# Patient Record
Sex: Female | Born: 1935 | Race: White | Hispanic: No | Smoking: Never smoker
Health system: Southern US, Community
[De-identification: ages and names within clinical notes are randomized; demographics above are authoritative.]

## PROBLEM LIST (undated history)

## (undated) DIAGNOSIS — S42009A Fracture of unspecified part of unspecified clavicle, initial encounter for closed fracture: Secondary | ICD-10-CM

## (undated) DIAGNOSIS — C801 Malignant (primary) neoplasm, unspecified: Secondary | ICD-10-CM

## (undated) DIAGNOSIS — K219 Gastro-esophageal reflux disease without esophagitis: Secondary | ICD-10-CM

## (undated) DIAGNOSIS — F329 Major depressive disorder, single episode, unspecified: Secondary | ICD-10-CM

## (undated) DIAGNOSIS — F0280 Dementia in other diseases classified elsewhere without behavioral disturbance: Secondary | ICD-10-CM

## (undated) DIAGNOSIS — S329XXA Fracture of unspecified parts of lumbosacral spine and pelvis, initial encounter for closed fracture: Secondary | ICD-10-CM

## (undated) DIAGNOSIS — E785 Hyperlipidemia, unspecified: Secondary | ICD-10-CM

## (undated) DIAGNOSIS — F419 Anxiety disorder, unspecified: Secondary | ICD-10-CM

## (undated) DIAGNOSIS — G309 Alzheimer's disease, unspecified: Secondary | ICD-10-CM

## (undated) DIAGNOSIS — I10 Essential (primary) hypertension: Secondary | ICD-10-CM

## (undated) DIAGNOSIS — Z66 Do not resuscitate: Secondary | ICD-10-CM

## (undated) DIAGNOSIS — F32A Depression, unspecified: Secondary | ICD-10-CM

## (undated) HISTORY — PX: FRACTURE SURGERY: SHX138

---

## 1998-05-09 ENCOUNTER — Encounter: Payer: Self-pay | Admitting: Orthopedic Surgery

## 1998-05-09 ENCOUNTER — Ambulatory Visit (HOSPITAL_COMMUNITY): Admission: RE | Admit: 1998-05-09 | Discharge: 1998-05-09 | Payer: Self-pay | Admitting: Orthopedic Surgery

## 1998-06-12 ENCOUNTER — Encounter: Payer: Self-pay | Admitting: Orthopedic Surgery

## 1998-06-13 ENCOUNTER — Encounter: Payer: Self-pay | Admitting: Orthopedic Surgery

## 1998-06-13 ENCOUNTER — Inpatient Hospital Stay (HOSPITAL_COMMUNITY): Admission: RE | Admit: 1998-06-13 | Discharge: 1998-06-19 | Payer: Self-pay | Admitting: Orthopedic Surgery

## 1998-07-15 ENCOUNTER — Encounter: Admission: RE | Admit: 1998-07-15 | Discharge: 1998-10-13 | Payer: Self-pay | Admitting: Orthopedic Surgery

## 2000-12-23 ENCOUNTER — Encounter (INDEPENDENT_AMBULATORY_CARE_PROVIDER_SITE_OTHER): Payer: Self-pay | Admitting: *Deleted

## 2000-12-24 ENCOUNTER — Encounter (INDEPENDENT_AMBULATORY_CARE_PROVIDER_SITE_OTHER): Payer: Self-pay | Admitting: *Deleted

## 2000-12-24 ENCOUNTER — Other Ambulatory Visit: Admission: RE | Admit: 2000-12-24 | Discharge: 2000-12-24 | Payer: Self-pay | Admitting: Gastroenterology

## 2000-12-24 ENCOUNTER — Encounter (INDEPENDENT_AMBULATORY_CARE_PROVIDER_SITE_OTHER): Payer: Self-pay | Admitting: Specialist

## 2001-06-03 ENCOUNTER — Encounter: Payer: Self-pay | Admitting: Ophthalmology

## 2001-06-07 ENCOUNTER — Ambulatory Visit (HOSPITAL_COMMUNITY): Admission: RE | Admit: 2001-06-07 | Discharge: 2001-06-08 | Payer: Self-pay | Admitting: Ophthalmology

## 2002-10-30 ENCOUNTER — Ambulatory Visit (HOSPITAL_COMMUNITY): Admission: RE | Admit: 2002-10-30 | Discharge: 2002-10-30 | Payer: Self-pay | Admitting: Family Medicine

## 2002-10-30 ENCOUNTER — Encounter: Payer: Self-pay | Admitting: Family Medicine

## 2003-12-12 ENCOUNTER — Ambulatory Visit (HOSPITAL_COMMUNITY): Admission: RE | Admit: 2003-12-12 | Discharge: 2003-12-12 | Payer: Self-pay | Admitting: Family Medicine

## 2004-07-02 ENCOUNTER — Ambulatory Visit: Payer: Self-pay | Admitting: Gastroenterology

## 2004-07-11 ENCOUNTER — Ambulatory Visit: Payer: Self-pay | Admitting: Gastroenterology

## 2004-07-17 ENCOUNTER — Ambulatory Visit: Payer: Self-pay | Admitting: Gastroenterology

## 2004-07-17 ENCOUNTER — Encounter (INDEPENDENT_AMBULATORY_CARE_PROVIDER_SITE_OTHER): Payer: Self-pay | Admitting: *Deleted

## 2005-03-10 ENCOUNTER — Ambulatory Visit (HOSPITAL_COMMUNITY): Admission: RE | Admit: 2005-03-10 | Discharge: 2005-03-10 | Payer: Self-pay | Admitting: Family Medicine

## 2005-11-23 ENCOUNTER — Ambulatory Visit: Payer: Self-pay | Admitting: Gastroenterology

## 2006-02-16 ENCOUNTER — Encounter (HOSPITAL_COMMUNITY): Admission: RE | Admit: 2006-02-16 | Discharge: 2006-03-18 | Payer: Self-pay | Admitting: Orthopedic Surgery

## 2006-03-23 ENCOUNTER — Encounter (HOSPITAL_COMMUNITY): Admission: RE | Admit: 2006-03-23 | Discharge: 2006-04-22 | Payer: Self-pay | Admitting: Orthopedic Surgery

## 2007-06-07 ENCOUNTER — Encounter: Admission: RE | Admit: 2007-06-07 | Discharge: 2007-06-07 | Payer: Self-pay | Admitting: Orthopedic Surgery

## 2007-12-12 ENCOUNTER — Ambulatory Visit (HOSPITAL_COMMUNITY): Admission: RE | Admit: 2007-12-12 | Discharge: 2007-12-12 | Payer: Self-pay | Admitting: Orthopedic Surgery

## 2008-02-03 ENCOUNTER — Encounter (HOSPITAL_COMMUNITY): Admission: RE | Admit: 2008-02-03 | Discharge: 2008-02-13 | Payer: Self-pay | Admitting: Internal Medicine

## 2008-02-03 ENCOUNTER — Ambulatory Visit (HOSPITAL_COMMUNITY): Payer: Self-pay | Admitting: Internal Medicine

## 2009-02-26 ENCOUNTER — Encounter (HOSPITAL_COMMUNITY): Admission: RE | Admit: 2009-02-26 | Discharge: 2009-03-28 | Payer: Self-pay | Admitting: Internal Medicine

## 2009-02-26 ENCOUNTER — Ambulatory Visit (HOSPITAL_COMMUNITY): Payer: Self-pay | Admitting: Internal Medicine

## 2009-06-05 ENCOUNTER — Emergency Department (HOSPITAL_COMMUNITY)
Admission: EM | Admit: 2009-06-05 | Discharge: 2009-06-05 | Payer: Self-pay | Source: Home / Self Care | Admitting: Emergency Medicine

## 2009-06-19 ENCOUNTER — Emergency Department (HOSPITAL_COMMUNITY): Admission: EM | Admit: 2009-06-19 | Discharge: 2009-06-20 | Payer: Self-pay | Admitting: Emergency Medicine

## 2009-10-03 ENCOUNTER — Ambulatory Visit (HOSPITAL_COMMUNITY): Admission: RE | Admit: 2009-10-03 | Discharge: 2009-10-03 | Payer: Self-pay | Admitting: Ophthalmology

## 2009-10-12 ENCOUNTER — Emergency Department (HOSPITAL_COMMUNITY): Admission: EM | Admit: 2009-10-12 | Discharge: 2009-10-12 | Payer: Self-pay | Admitting: Emergency Medicine

## 2009-10-16 ENCOUNTER — Emergency Department (HOSPITAL_COMMUNITY): Admission: EM | Admit: 2009-10-16 | Discharge: 2009-10-16 | Payer: Self-pay | Admitting: Emergency Medicine

## 2009-10-18 ENCOUNTER — Encounter: Payer: Self-pay | Admitting: Nurse Practitioner

## 2009-10-22 ENCOUNTER — Telehealth (INDEPENDENT_AMBULATORY_CARE_PROVIDER_SITE_OTHER): Payer: Self-pay | Admitting: *Deleted

## 2009-10-25 ENCOUNTER — Ambulatory Visit: Payer: Self-pay | Admitting: Internal Medicine

## 2009-10-25 DIAGNOSIS — F068 Other specified mental disorders due to known physiological condition: Secondary | ICD-10-CM | POA: Insufficient documentation

## 2009-10-25 DIAGNOSIS — Z85038 Personal history of other malignant neoplasm of large intestine: Secondary | ICD-10-CM | POA: Insufficient documentation

## 2009-10-25 DIAGNOSIS — K219 Gastro-esophageal reflux disease without esophagitis: Secondary | ICD-10-CM | POA: Insufficient documentation

## 2009-10-29 ENCOUNTER — Telehealth: Payer: Self-pay | Admitting: Internal Medicine

## 2009-12-19 ENCOUNTER — Ambulatory Visit: Payer: Self-pay | Admitting: Internal Medicine

## 2010-01-16 ENCOUNTER — Ambulatory Visit (HOSPITAL_COMMUNITY): Payer: Self-pay | Admitting: Internal Medicine

## 2010-01-16 ENCOUNTER — Encounter (HOSPITAL_COMMUNITY)
Admission: RE | Admit: 2010-01-16 | Discharge: 2010-02-14 | Payer: Self-pay | Source: Home / Self Care | Admitting: Internal Medicine

## 2010-06-17 NOTE — Progress Notes (Signed)
Summary: Bright red Blood in her stools  Phone Note From Other Clinic   Caller: [facility] @ Dr Dwana Melena [phone] Reason for Call: Schedule Patient Appt Summary of Call: Former pt of Dr Corinda Gubler last seen 2007-Colon 07-2004. Having some bright red blood in her stools and tried to schedule her appt to be seen sooner rather than later. First available not until July. Believes patient just needs a Colonoscopy scheduled directly.  Initial call taken by: Leanor Kail [facility],  October 22, 2009 2:55 PM  Follow-up for Phone Call        Pt. will see Willette Cluster NP on 10-25-09 at 1:30pm. Okey Regal will advise pt. of appt/med.list/co-pay/cx.policy and she will fax records to [phone]. Follow-up by: Laureen Ochs LPN,  October 23, 91 3:10 PM

## 2010-06-17 NOTE — Procedures (Signed)
Summary: Colonoscopy: Hemorrhoids   Colonoscopy  Procedure date:  07/17/2004  Findings:      Location:  [facility].    Colonoscopy  Procedure date:  07/17/2004  Findings:      Location:  [facility].     Patient Name: Kathy, Avery MRN:  Procedure Procedures: Colonoscopy CPT: [phone].  Personnel: Endoscopist: Ulyess Mort, MD.  Exam Location: Exam performed in Outpatient Clinic. Outpatient  Patient Consent: Procedure, Alternatives, Risks and Benefits discussed, consent obtained, from patient. Consent was obtained by the RN.  Indications Symptoms: Hematochezia.  Surveillance of: Colorectal Cancer.  History  Current Medications: Patient is not currently taking Coumadin.  Pre-Exam Physical: Entire physical exam was normal.  Exam Exam: Extent of exam reached: Cecum, extent intended: Cecum.  The cecum was identified by appendiceal orifice and IC valve. Colon retroflexion performed. Images were not taken. ASA Classification: II. Tolerance: good.  Monitoring: Pulse and BP monitoring, Oximetry used. Supplemental O2 given.  Colon Prep Prep results: good.  Sedation Meds: Patient assessed and found to be appropriate for moderate (conscious) sedation. Fentanyl 50 mcg. given IV. Versed 5 mg. given IV.  Findings MUCOSAL ABNORMALITY: Ascending Colon. Comments: area of thickened mucosa  caterized with hot bx.  - NOT SEEN ON EXAM: Cecum to Rectum. Polyps, AVM's, Colitis, Tumors, Melanosis, Crohn's,  - PRIOR SURGERY: Sigmoid Colon. Segmental Colectomy.  - HEMORRHOIDS: External. Size: Grade II. ICD9: Hemorrhoids, External: 455.3.   Assessment Abnormal examination, see findings above.  Diagnoses: 455.3: Hemorrhoids, External.   Events  Unplanned Interventions: No intervention was required.  Unplanned Events: There were no complications. Plans Medication Plan: Continue current medications. Fiber supplements:  Hemorrhoidal  Medications:   Patient Education: Patient given standard instructions for: Hemorrhoids. Yearly hemoccult testing recommended. prn.  Disposition: After procedure patient sent to recovery. After recovery patient sent home.    This report was created from the original endoscopy report, which was reviewed and signed by the above listed endoscopist.  Appended Document: Colonoscopy: Hemorrhoids    Colonoscopy  Procedure date:  07/17/2004  Findings:      Results: Hemorrhoids. Location:  [facility].  Findings MUCOSAL ABNORMALITY: Ascending Colon. Comments: area of thickened mucosa  caterized with hot bx.  - PRIOR SURGERY: Sigmoid Colon. Segmental Colectomy.  - HEMORRHOIDS: External. Size: Grade II

## 2010-06-17 NOTE — Assessment & Plan Note (Signed)
Summary: RECTAL BLEEDING          (OLD DR.SAM PT.)      DEBORAH   History of Present Illness Visit Type: new patient  Primary GI MD: Stan Head MD [facility] Primary Provider: Dwana Melena, MD  Requesting Provider: na Chief Complaint: rectal bleeding, and fatigue      History of Present Illness:   Patient last seen by Dr. Corinda Gubler in 2006. She has a history of Colorectal cancer.  Patient lives at [facility], has history of dementia and is brought in today by her son. Recently went to ER after a fall. While in the ER a nurse noticed blood in patien't's stool. ER hemoglobin was okay at 12.0.  Then  a few days ago she went back to ER for a second fall,  hgb was still okay at 12.1. Son dioesn't know if  there has been any further rectal bleeding. Patient denies constipation. She  sometimes has an "upset stomach"  for which son gives her Emetrol. No weight loss, in fact has gained weight.    GI Review of Systems    Reports weight gain.      Denies abdominal pain, acid reflux, belching, bloating, chest pain, dysphagia with liquids, dysphagia with solids, heartburn, loss of appetite, nausea, vomiting, vomiting blood, and  weight loss.      Reports rectal bleeding.     Denies anal fissure, black tarry stools, change in bowel habit, constipation, diarrhea, diverticulosis, fecal incontinence, heme positive stool, hemorrhoids, irritable bowel syndrome, jaundice, light color stool, liver problems, and  rectal pain.    Current Medications (verified): 1)  Crestor 10 Mg Tabs (Rosuvastatin Calcium) .... Take 1 Tab By Mouth Daily 2)  D 1000 Plus  Tabs (Fa-Cyanocobalamin-B6-D-Ca) .... Take 1 Cap By Mouth Daily 3)  Citalopram Hydrobromide 40 Mg Tabs (Citalopram Hydrobromide) .... Take 1 Tab By Mouth Daily 4)  Zantac 150 Mg Tabs (Ranitidine Hcl) .... Take 1 Tab Daily By Mouth Every Evening 5)  Prilosec Otc 20 Mg Tbec (Omeprazole Magnesium) .... Take 1 Cap By Mouth Every Morning 6)  Exelon 9.5 Mg/24hr Pt24  (Rivastigmine) .... Apply 1 Patch To Skin Once Daily. Remove Used Patch Before Applying New Patch. 7)  Vitamin C 500 Mg Tabs (Ascorbic Acid) .... Take 2 Tab By Mouth By Mouth Once 8)  Calcium 600 Mg Tabs (Calcium) .... One Tablet By Mouth Once Daily 9)  Tekturna 300 Mg Tabs (Aliskiren Fumarate) .... One Tablet By Mouth Once Daily 10)  Ketorolac Tromethamine 0.4 % Soln (Ketorolac Tromethamine) .... As Directed 11)  Namenda 10 Mg Tabs (Memantine Hcl) .... One Tablet By Mouth Once Daily 12)  Clonazepam 0.5 Mg Tabs (Clonazepam) .... One Tablet By Mouth Once Daily 13)  Pred Forte 1 % Susp (Prednisolone Acetate) .... As  Directed 14)  Ocuflox 0.3 % Soln (Ofloxacin) .... As Directed 15)  Acetaminophen 500 Mg Tabs (Acetaminophen) .... One Tablet By Mouth Every 8-Hours As Needed For Pain 16)  Promethazine Hcl 12.5 Mg Tabs (Promethazine Hcl) .... As Needed For Nausea 17)  Lasix 20 Mg Tabs (Furosemide) .... Once Daily By Mouth  Allergies (verified): No Known Drug Allergies  Past History:  Past Medical History: Anxiety Disorder Arthritis Colorectal Cancer Hyperlipidemia Hypertension Dementia GERD  Past Surgical History: Cholecystectomy Colon Resection  Family History: No FH of Colon Cancer:  Social History: Retired Widowed Childern  Patient is a former smoker.  Alcohol Use - no Illicit Drug Use - no Smoking Status:  quit Drug  Use:  no  Review of Systems       The patient complains of anxiety-new, arthritis/joint pain, confusion, depression-new, fatigue, and swelling of feet/legs.  The patient denies allergy/sinus, anemia, back pain, blood in urine, breast changes/lumps, change in vision, cough, coughing up blood, fainting, fever, headaches-new, hearing problems, heart murmur, heart rhythm changes, itching, menstrual pain, muscle pains/cramps, night sweats, nosebleeds, pregnancy symptoms, shortness of breath, skin rash, sleeping problems, sore throat, swollen lymph glands, thirst -  excessive, urination - excessive, urination changes/pain, urine leakage, vision changes, and voice change.    Vital Signs:  Patient profile:   77 year old female Height:      67 inches Weight:      244 pounds BMI:     38.35 BSA:     2.20 Pulse rate:   76 / minute Pulse rhythm:   regular BP sitting:   110 / 60  (left arm) Cuff size:   regular  Vitals Entered By: Ok Anis CMA (October 25, 2009 1:41 PM)  Physical Exam  General:  Well developed, well nourished, no acute distress. Head:  Normocephalic and atraumatic. Eyes:  Conjunctiva pink, no icterus.  Mouth:  No oral lesions. Tongue moist.  Neck:  no obvious masses  Lungs:  Clear throughout to auscultation. Heart:  Regular rate and rhythm; no murmurs, rubs,  or bruits. Abdomen:  Abdomen soft, nontender, nondistended. No obvious masses or hepatomegaly.Normal bowel sounds.  Rectal:  No pain on digital exam. No external hemorrhoids. No fissure. No internal masses felt. Small amount brown, heme negative brown stool. On anoscopic view, internal hemorrhoids were seen.  Msk:  Symmetrical with no gross deformities. Normal posture. Extremities:  No clubbing, cyanosis, edema or deformities noted. Neurologic:  Alert, poor memory. Skin:  Intact without significant lesions or rashes. Cervical Nodes:  No significant cervical adenopathy. Psych:  Alert and cooperative. Normal mood and affect.   Impression & Recommendations:  Problem # 1:  RECTAL BLEEDING (ICD-569.3) Son provides history. Blood in stool noticed by a nurse on May      Hemoglobin has been stable around 12.0 since then. Internal hemorrhoids on exam. Will treat with Aunusol HC suppositories. Do not know if she has problems with constipation but will ask [facility] to give daily Miralax if needed.   Problem # 2:  PERSONAL HX COLON CANCER (ICD-V10.05) Assessment: Comment Only S/P resection 1995. Her last surveillance colonoscopy was March 2006. Findings included area of  thickened mucosa in ascending colon. Area cauterized with hot biopsy but unable to locate any pathology report. Dr. Leone Payor will resume patient's care since Dr. Corinda Gubler has retired. Will defer to him as to when patient's next surveillance colonoscopy should be.   ATTENDING: I will review all pertinent records, but think we should discuss either by phone or with officevisit prior to proceeding. Iva Boop MD, [facility]  October 28, 2009 10:43 AM  Problem # 3:  GERD (ICD-530.81) According to son, patient used to have heartburn problems. She is on PPI and H2 blocker.   Problem # 4:  DEMENTIA (ICD-294.8)  Patient Instructions: 1)  Attn: Clare Gandy, RN: 2)  Please call our office for any further rectal bleedingl  3)  Please make sure patient doesn not get constipated. She should take a capful of Miralax daily for hard stools and / or straining.  4)  Anusol HC suppositories prescription given. 5)  Will call Unice Cobble, patient's son about when next colonoscopy for history of colon cancer will  need to be done.Marland Kitchen His number is 6)  [phone]. 7)  The medication list was reviewed and reconciled.  All changed / newly prescribed medications were explained.  A complete medication list was provided to the patient / caregiver. Prescriptions: ANUSOL-HC 25 MG SUPP (HYDROCORTISONE ACETATE) use 1 suppository at bedtime  #10 x 0   Entered by:   Lowry Ram NCMA   Authorized by:   Willette Cluster NP   Signed by:   Lowry Ram NCMA on 10/25/2009   Method used:   Print then Give to Patient   RxID:   [id number]   Appended Document: RECTAL BLEEDING          (OLD DR.SAM PT.)      [facility]   Colonoscopy  Procedure date:  07/17/2004  Findings:      Results: Hemorrhoids.     Dr. Blossom Hoops    Past History:  Past Medical History:    Anxiety Disorder    Arthritis    Colorectal Cancer 1981    Hyperlipidemia    Hypertension    Dementia    GERD  Past Surgical History:    Cholecystectomy     Colon Resection 1981

## 2010-06-17 NOTE — Progress Notes (Signed)
Summary: call son re: f/u  Phone Note Outgoing Call Call back at [facility]   Summary of Call: please contact son re: need to arrange follow-up visit with him and mom to discuss if further evaluation of rectal bleeding needed as long as improving with hemorrhoid Tx I can see them in July Iva Boop MD, [facility]  October 29, 2009 7:28 AM   Follow-up for Phone Call        no answer and no machine at the home phone, I will continue to try and reach the patient. Darcey Nora RN, CGRN  October 29, 2009 9:02 AM  no answer, no machine.  We will continue to try and reach the patient and her son Follow-up by: Darcey Nora RN, CGRN,  October 29, 2009 2:34 PM  Additional Follow-up for Phone Call Additional follow up Details #1::        Message left on son's (John) cell phone voicemail to return call. Francee Piccolo CMA Duncan Dull)  October 30, 2009 9:20 AM  Son, [place], returned call.  He states his mother lives at an assisted living facility and he is unsure how she is doing.  He will call assisted living tomorrow (he is traveling today) to see if hemorrhoid treatment is working.  He has to coordinate with his brother, Jonny Ruiz to schedule an appt. as her POA will have to be with her at any appt.  Orvilla Fus will return my call on Friday. Additional Follow-up by: Francee Piccolo CMA Duncan Dull),  October 30, 2009 4:02 PM    Additional Follow-up for Phone Call Additional follow up Details #2::    Call to John on cell, appt scheduled for 11/26/09.  As far as he knows bleeding is better and she is doing well. Follow-up by: Francee Piccolo CMA Duncan Dull),  November 14, 2009 1:29 PM

## 2010-06-17 NOTE — Procedures (Signed)
Summary: COLONOSCOPY   Colonoscopy  Procedure date:  12/23/2000  Findings:      Location:  [facility].     Patient Name: Kathy Avery, Kathy Avery MRN:  Procedure Procedures: Colonoscopy CPT: [phone].  Personnel: Endoscopist: Ulyess Mort, MD.  Exam Location: Exam performed in Outpatient Clinic. Outpatient  Patient Consent: Procedure, Alternatives, Risks and Benefits discussed, consent obtained, from patient.  Indications  Surveillance of: Colorectal Cancer. Last exam: Jun,  History  Pre-Exam Physical: Performed Dec 23, 2000. Cardio-pulmonary exam, HEENT exam , Abdominal exam, Extremity exam, Mental status exam WNL.  Exam Exam: Extent of exam reached: Cecum, extent intended: Cecum.  The cecum was identified by IC valve. Patient position: on left side. Colon retroflexion performed. Images taken. ASA Classification: II. Tolerance: good.  Monitoring: Pulse and BP monitoring, Oximetry used. Supplemental O2 given.  Colon Prep Used Golytely for colon prep. Prep results: fair.  Sedation Meds: Patient assessed and found to be appropriate for moderate (conscious) sedation. Residual sedation present from prior procedure today.  Fentanyl 100 mcg. Versed 10 mg.  Findings , IMAGE TAKEN - ANATOMICAL DEFORMITY: Sigmoid Colon. anastamosis.  POLYP: Sigmoid Colon, Maximum size: 4 mm. sessile polyp. Procedure:  hot biopsy, removed, retrieved, Polyp sent to pathology. ICD9: Colon Polyps: 211.3.   Assessment Abnormal examination, see findings above.  Diagnoses: 211.3: Colon Polyps.   Events  Unplanned Interventions: No intervention was required.  Unplanned Events: There were no complications. Plans Medication Plan: Continue current medications.  Patient Education: Patient given standard instructions for: Polyps. Yearly hemoccult testing recommended. Patient instructed to get routine colonoscopy every 5 years.  Disposition: After procedure patient sent to  recovery. After recovery patient sent home.    This report was created from the original endoscopy report, which was reviewed and signed by the above listed endoscopist.

## 2010-06-17 NOTE — Letter (Signed)
Summary: Dwana Melena MD  Dwana Melena MD   Imported By: Lester [place] 11/13/2009 11:09:27  _____________________________________________________________________  External Attachment:    Type:   Image     Comment:   External Document

## 2010-06-17 NOTE — Procedures (Signed)
Summary: ENDOSCOPY   EGD  Procedure date:  12/23/2000  Findings:      Location: [facility]     Patient Name: Kathy Avery, Kathy Avery MRN:  Procedure Procedures: Panendoscopy (EGD) CPT: 43235.  Personnel: Endoscopist: Ulyess Mort, MD.  Exam Location: Exam performed in Outpatient Clinic. Outpatient  Patient Consent: Procedure, Alternatives, Risks and Benefits discussed, consent obtained, from patient.  Indications Symptoms: Reflux symptoms  Surveillance of: H. pylori.  History  Pre-Exam Physical: Performed Dec 23, 2000  Cardio-pulmonary exam, HEENT exam, Abdominal exam, Extremity exam, Mental status exam WNL.  Exam Exam Info: Maximum depth of insertion Duodenum, intended Duodenum. Patient position: on left side. Vocal cords visualized. Gastric retroflexion performed. Images were not taken. ASA Classification: II. Tolerance: good.  Sedation Meds: Patient assessed and found to be appropriate for moderate (conscious) sedation. Fentanyl 50 mcg. Versed 7 mg. Cetacaine Spray 1 sprays  Monitoring: BP and pulse monitoring done. Oximetry used. Supplemental O2 given  Findings - HIATAL HERNIA: 2 cms. in length. Comments: mild esophagitis.  - MUCOSAL ABNORMALITY: Body to Antrum. Hemorrhage (oozing). Erythematous mucosa. Granular mucosa. RUT done, results pending. Comment: mild hemmorhagic antritis.  - MUCOSAL ABNORMALITY: Duodenal Bulb to Duodenal 2nd Portion. Granular mucosa. Comment: min duodenitis.   Assessment Abnormal examination, see findings above.  Events  Unplanned Intervention: No unplanned interventions were required.  Unplanned Events: There were no complications. Plans Medication(s): Continue current medications.  Disposition: After procedure patient sent to recovery. After recovery patient sent home.    This report was created from the original endoscopy report, which was reviewed and signed by the above listed endoscopist.   Appended  Document: EGD   EGD  Procedure date:  12/23/2000  Findings:      Findings - HIATAL HERNIA: 2 cms. in length. Comments: mild esophagitis.  - MUCOSAL ABNORMALITY: Body to Antrum. Hemorrhage (oozing). Erythematous mucosa. Granular mucosa. RUT done, results pending. Comment: mild hemmorhagic antritis.  - MUCOSAL ABNORMALITY: Duodenal Bulb to Duodenal 2nd Portion. Granular mucosa. Comment: min duodenitis.

## 2010-06-17 NOTE — Assessment & Plan Note (Signed)
Summary: FOLLOW UP RECTAL BLEEDING/SP   History of Present Illness Visit Type: Follow-up Visit Primary GI MD: Stan Head MD [facility] Primary Prentice Sackrider: Dwana Melena, MD  Requesting Jayelle Page: na Chief Complaint: follow-up rectal bleeding History of Present Illness:   77 yo ww seen within past few weeks due to rectal bleeding. She had a hx of hemorrhoids and these were seen again at anoscopy. Hydrocortisone suppositories were prescribed and she has not had further bleeding. She is demented and history largely comes from son.   GI Review of Systems      Denies abdominal pain, acid reflux, belching, bloating, chest pain, dysphagia with liquids, dysphagia with solids, heartburn, loss of appetite, nausea, vomiting, vomiting blood, weight loss, and  weight gain.        Denies anal fissure, black tarry stools, change in bowel habit, constipation, diarrhea, diverticulosis, fecal incontinence, heme positive stool, hemorrhoids, irritable bowel syndrome, jaundice, light color stool, liver problems, rectal bleeding, and  rectal pain.    Current Medications (verified): 1)  Crestor 10 Mg Tabs (Rosuvastatin Calcium) .... Take 1 Tab By Mouth Daily 2)  D 1000 Plus  Tabs (Fa-Cyanocobalamin-B6-D-Ca) .... Take 1 Cap By Mouth Daily 3)  Citalopram Hydrobromide 40 Mg Tabs (Citalopram Hydrobromide) .... Take 1 Tab By Mouth Daily 4)  Zantac 150 Mg Tabs (Ranitidine Hcl) .... Take 1 Tab Daily By Mouth Every Evening 5)  Prilosec Otc 20 Mg Tbec (Omeprazole Magnesium) .... Take 1 Cap By Mouth Every Morning 6)  Exelon 9.5 Mg/24hr Pt24 (Rivastigmine) .... Apply 1 Patch To Skin Once Daily. Remove Used Patch Before Applying New Patch. 7)  Vitamin C 500 Mg Tabs (Ascorbic Acid) .... Take 2 Tab By Mouth By Mouth Once 8)  Calcium 600 Mg Tabs (Calcium) .... One Tablet By Mouth Once Daily 9)  Tekturna 300 Mg Tabs (Aliskiren Fumarate) .... One Tablet By Mouth Once Daily 10)  Ketorolac Tromethamine 0.4 % Soln (Ketorolac Tromethamine)  .... As Directed 11)  Namenda 10 Mg Tabs (Memantine Hcl) .... One Tablet By Mouth Once Daily 12)  Clonazepam 0.5 Mg Tabs (Clonazepam) .... One Tablet By Mouth Once Daily 13)  Acetaminophen 500 Mg Tabs (Acetaminophen) .... One Tablet By Mouth Every 8-Hours As Needed For Pain 14)  Promethazine Hcl 12.5 Mg Tabs (Promethazine Hcl) .... As Needed For Nausea 15)  Lasix 20 Mg Tabs (Furosemide) .... Once Daily By Mouth 16)  Anusol-Hc 25 Mg Supp (Hydrocortisone Acetate) .... Use 1 Suppository At Bedtime  Allergies (verified): No Known Drug Allergies  Past History:  Past Medical History: Reviewed history from 10/29/2009 and no changes required. Anxiety Disorder Arthritis Colorectal Cancer 1981 Hyperlipidemia Hypertension Dementia GERD  Past Surgical History: Reviewed history from 10/29/2009 and no changes required. Cholecystectomy Colon Resection 1981  Family History: Reviewed history from 10/25/2009 and no changes required. No FH of Colon Cancer:  Social History: Reviewed history from 10/25/2009 and no changes required. Retired Industrial/product designer  Patient is a former smoker.  Alcohol Use - no Illicit Drug Use - no  Vital Signs:  Patient profile:   77 year old female Height:      67 inches Weight:      161 pounds BMI:     25.31 Pulse rate:   80 / minute Pulse rhythm:   regular BP sitting:   108 / 52  (left arm)  Vitals Entered By: Milford Cage NCMA (December 19, 2009 2:48 PM)  Physical Exam  General:  Well developed, well nourished, no acute distress. elderly  and deented.   Impression & Recommendations:  Problem # 1:  HEMORRHOIDS, WITH BLEEDING (ICD-455.8) Assessment Improved History and anoscopic exam consistent with this.  Problem # 2:  PERSONAL HX COLON CANCER (ICD-V10.05) Assessment: Comment Only Remote resection 1981 with last two colonoscopies at least without adenomas. I do not recommend continuing screening exams due to age and comorbidities. Son is  aware and will discuss with his brother. The patient is unable to decide this given her dementia.  Patient Instructions: 1)  The rectal bleeding was from hemorroids and appears to be resolved. 2)  Dr. Leone Payor does not recommend continued routine colonoscopy at this time due to age and comorbidities. the risks of the procedure do not seem to outweigh the possible benefits. He understands that your sons will discuss this recommendation and call back with questions or requests. 3)  Copy sent to : Dwana Melena, MD 4)  The medication list was reviewed and reconciled.  All changed / newly prescribed medications were explained.  A complete medication list was provided to the patient / caregiver.

## 2010-08-03 LAB — URINE CULTURE: Colony Count: >=100000

## 2010-08-03 LAB — URINALYSIS, ROUTINE W REFLEX MICROSCOPIC
Bilirubin Urine: NEGATIVE
Glucose, UA: NEGATIVE mg/dL
Hgb urine dipstick: NEGATIVE
Nitrite: POSITIVE — AB
Protein, ur: NEGATIVE mg/dL
Specific Gravity, Urine: 1.025 (ref 1.005–1.030)

## 2010-08-03 LAB — CBC
HCT: 38.7 % (ref 36.0–46.0)
Hemoglobin: 12.9 g/dL (ref 12.0–15.0)
MCHC: 33.5 g/dL (ref 30.0–36.0)
MCV: 93.5 fL (ref 78.0–100.0)
Platelets: 166 10*3/uL (ref 150–400)
WBC: 5.4 10*3/uL (ref 4.0–10.5)

## 2010-08-03 LAB — URINE MICROSCOPIC-ADD ON

## 2010-08-03 LAB — DIFFERENTIAL
Basophils Relative: 1 % (ref 0–1)
Eosinophils Absolute: 0 10*3/uL (ref 0.0–0.7)
Eosinophils Relative: 1 % (ref 0–5)
Lymphs Abs: 1 10*3/uL (ref 0.7–4.0)
Monocytes Absolute: 0.6 10*3/uL (ref 0.1–1.0)
Monocytes Relative: 12 % (ref 3–12)
Neutro Abs: 3.7 10*3/uL (ref 1.7–7.7)

## 2010-08-03 LAB — COMPREHENSIVE METABOLIC PANEL
ALT: 22 U/L (ref 0–35)
AST: 21 U/L (ref 0–37)
Albumin: 3.7 g/dL (ref 3.5–5.2)
CO2: 32 mEq/L (ref 19–32)
Calcium: 9.2 mg/dL (ref 8.4–10.5)
Creatinine, Ser: 1.09 mg/dL (ref 0.4–1.2)
Total Protein: 5.9 g/dL — ABNORMAL LOW (ref 6.0–8.3)

## 2010-08-04 LAB — DIFFERENTIAL
Basophils Absolute: 0 10*3/uL (ref 0.0–0.1)
Basophils Relative: 1 % (ref 0–1)
Basophils Relative: 1 % (ref 0–1)
Eosinophils Absolute: 0 10*3/uL (ref 0.0–0.7)
Eosinophils Relative: 1 % (ref 0–5)
Lymphocytes Relative: 10 % — ABNORMAL LOW (ref 12–46)
Lymphs Abs: 1 10*3/uL (ref 0.7–4.0)
Monocytes Absolute: 0.3 10*3/uL (ref 0.1–1.0)
Monocytes Relative: 12 % (ref 3–12)
Neutro Abs: 4.6 10*3/uL (ref 1.7–7.7)
Neutrophils Relative %: 68 % (ref 43–77)
Neutrophils Relative %: 84 % — ABNORMAL HIGH (ref 43–77)

## 2010-08-04 LAB — CBC
HCT: 36.6 % (ref 36.0–46.0)
Hemoglobin: 12.1 g/dL (ref 12.0–15.0)
MCHC: 33.1 g/dL (ref 30.0–36.0)
MCHC: 33.2 g/dL (ref 30.0–36.0)
MCV: 92.6 fL (ref 78.0–100.0)
Platelets: 139 10*3/uL — ABNORMAL LOW (ref 150–400)
Platelets: 119 10*3/uL — ABNORMAL LOW (ref 150–400)
RDW: 13.4 % (ref 11.5–15.5)

## 2010-08-04 LAB — BASIC METABOLIC PANEL
CO2: 29 mEq/L (ref 19–32)
Calcium: 9.1 mg/dL (ref 8.4–10.5)
Calcium: 8.8 mg/dL (ref 8.4–10.5)
Chloride: 105 mEq/L (ref 96–112)
Creatinine, Ser: 1.1 mg/dL (ref 0.4–1.2)
GFR calc Af Amer: 55 mL/min — ABNORMAL LOW (ref >60)
GFR calc non Af Amer: 45 mL/min — ABNORMAL LOW (ref >60)
GFR calc non Af Amer: 47 mL/min — ABNORMAL LOW (ref >60)
Potassium: 4 mEq/L (ref 3.5–5.1)
Sodium: 140 mEq/L (ref 135–145)
Sodium: 139 mEq/L (ref 135–145)

## 2010-08-04 LAB — URINALYSIS, ROUTINE W REFLEX MICROSCOPIC
Glucose, UA: NEGATIVE mg/dL
Nitrite: NEGATIVE
Protein, ur: NEGATIVE mg/dL
Urobilinogen, UA: 0.2 mg/dL (ref 0.0–1.0)

## 2010-08-04 LAB — POCT CARDIAC MARKERS
CKMB, poc: <1 ng/mL — ABNORMAL LOW (ref 1.0–8.0)
Troponin i, poc: <0.05 ng/mL (ref 0.00–0.09)

## 2010-08-05 LAB — BASIC METABOLIC PANEL
BUN: 30 mg/dL — ABNORMAL HIGH (ref 6–23)
Creatinine, Ser: 1.22 mg/dL — ABNORMAL HIGH (ref 0.4–1.2)
GFR calc non Af Amer: 42 mL/min — ABNORMAL LOW (ref >60)
Glucose, Bld: 102 mg/dL — ABNORMAL HIGH (ref 70–99)
Potassium: 4.6 mEq/L (ref 3.5–5.1)

## 2010-08-05 LAB — HEMOGLOBIN AND HEMATOCRIT, BLOOD: Hemoglobin: 12.7 g/dL (ref 12.0–15.0)

## 2010-08-06 LAB — URINALYSIS, ROUTINE W REFLEX MICROSCOPIC
Bilirubin Urine: NEGATIVE
Glucose, UA: NEGATIVE mg/dL
Hgb urine dipstick: NEGATIVE
Specific Gravity, Urine: <1.005 — ABNORMAL LOW (ref 1.005–1.030)

## 2010-08-06 LAB — BASIC METABOLIC PANEL
CO2: 28 mEq/L (ref 19–32)
Chloride: 105 mEq/L (ref 96–112)
GFR calc Af Amer: >60 mL/min (ref >60)
Glucose, Bld: 94 mg/dL (ref 70–99)
Sodium: 139 mEq/L (ref 135–145)

## 2010-08-06 LAB — DIFFERENTIAL
Basophils Absolute: 0 10*3/uL (ref 0.0–0.1)
Basophils Relative: 1 % (ref 0–1)
Eosinophils Absolute: 0 10*3/uL (ref 0.0–0.7)
Eosinophils Relative: 1 % (ref 0–5)
Monocytes Absolute: 0.6 10*3/uL (ref 0.1–1.0)
Monocytes Relative: 11 % (ref 3–12)

## 2010-08-06 LAB — POCT CARDIAC MARKERS
CKMB, poc: <1 ng/mL — ABNORMAL LOW (ref 1.0–8.0)
Myoglobin, poc: 62.5 ng/mL (ref 12–200)
Troponin i, poc: <0.05 ng/mL (ref 0.00–0.09)
Troponin i, poc: <0.05 ng/mL (ref 0.00–0.09)

## 2010-08-06 LAB — CBC
HCT: 35.2 % — ABNORMAL LOW (ref 36.0–46.0)
Hemoglobin: 12.1 g/dL (ref 12.0–15.0)
MCHC: 34.3 g/dL (ref 30.0–36.0)
MCV: 92.4 fL (ref 78.0–100.0)
RBC: 3.8 MIL/uL — ABNORMAL LOW (ref 3.87–5.11)
RDW: 13 % (ref 11.5–15.5)

## 2010-09-12 ENCOUNTER — Emergency Department (HOSPITAL_COMMUNITY): Payer: Medicare Other

## 2010-09-12 ENCOUNTER — Emergency Department (HOSPITAL_COMMUNITY)
Admission: EM | Admit: 2010-09-12 | Discharge: 2010-09-12 | Disposition: A | Discharge status: Home / Self Care | Payer: Medicare Other | Attending: Emergency Medicine | Admitting: Emergency Medicine

## 2010-09-12 DIAGNOSIS — E785 Hyperlipidemia, unspecified: Secondary | ICD-10-CM | POA: Insufficient documentation

## 2010-09-12 DIAGNOSIS — Z79899 Other long term (current) drug therapy: Secondary | ICD-10-CM | POA: Insufficient documentation

## 2010-09-12 DIAGNOSIS — T07XXXA Unspecified multiple injuries, initial encounter: Secondary | ICD-10-CM | POA: Insufficient documentation

## 2010-09-12 DIAGNOSIS — N390 Urinary tract infection, site not specified: Secondary | ICD-10-CM | POA: Insufficient documentation

## 2010-09-12 DIAGNOSIS — G309 Alzheimer's disease, unspecified: Secondary | ICD-10-CM | POA: Insufficient documentation

## 2010-09-12 DIAGNOSIS — I10 Essential (primary) hypertension: Secondary | ICD-10-CM | POA: Insufficient documentation

## 2010-09-12 DIAGNOSIS — Y9210 Unspecified residential institution as the place of occurrence of the external cause: Secondary | ICD-10-CM | POA: Insufficient documentation

## 2010-09-12 DIAGNOSIS — W010XXA Fall on same level from slipping, tripping and stumbling without subsequent striking against object, initial encounter: Secondary | ICD-10-CM | POA: Insufficient documentation

## 2010-09-12 DIAGNOSIS — F0280 Dementia in other diseases classified elsewhere without behavioral disturbance: Secondary | ICD-10-CM | POA: Insufficient documentation

## 2010-09-12 LAB — CBC
MCH: 30 pg (ref 26.0–34.0)
MCHC: 32 g/dL (ref 30.0–36.0)
Platelets: 152 10*3/uL (ref 150–400)
RBC: 4.13 MIL/uL (ref 3.87–5.11)

## 2010-09-12 LAB — BASIC METABOLIC PANEL
BUN: 25 mg/dL — ABNORMAL HIGH (ref 6–23)
Calcium: 9.1 mg/dL (ref 8.4–10.5)
Chloride: 101 mEq/L (ref 96–112)
Creatinine, Ser: 1.13 mg/dL (ref 0.4–1.2)

## 2010-09-12 LAB — URINALYSIS, ROUTINE W REFLEX MICROSCOPIC
Ketones, ur: NEGATIVE mg/dL
Nitrite: NEGATIVE
Protein, ur: NEGATIVE mg/dL
pH: 6 (ref 5.0–8.0)

## 2010-09-12 LAB — DIFFERENTIAL
Basophils Absolute: 0 10*3/uL (ref 0.0–0.1)
Basophils Relative: 1 % (ref 0–1)
Eosinophils Absolute: 0.1 10*3/uL (ref 0.0–0.7)
Monocytes Relative: 12 % (ref 3–12)
Neutrophils Relative %: 62 % (ref 43–77)

## 2010-09-14 LAB — URINE CULTURE
Colony Count: 50000
Culture  Setup Time: 201204282045

## 2010-10-03 NOTE — H&P (Signed)
[place]. [facility]  Patient:    Kathy Avery, Kathy Avery Visit Number: [id number] MRN: [id number]          Service Type: DSU Location: 5700 5713 01 Attending Physician:  Ivor Messier Dictated by:   Guadelupe Sabin, M.D. Admit Date:  06/07/2001                           History and Physical  REASON FOR ADMISSION:  This was a planned outpatient surgical admission of this 77 year old white female admitted for cataract implant surgery of the right eye.  PRESENT ILLNESS:  This patient has been followed in my office since May 20, 1992.  Initial examination at that time revealed a corrected acuity of 20/30 right eye, 20/25 left eye.  Acute posterior vitreous detachment was noted in the right eye but no retinal tear seen.  The patient returned to the office in 2002 stating that she was having trouble reading fine print and that blurred vision both at near and distance troubled her.  Cataract had been noted by Dr. Katrinka Blazing, her regular physician.  Examination revealed an acuity of 20/60 right eye, 20/40 left eye.  Nuclear cataract formation was confirmed.  Due to the patients problems in her daily living it was elected to proceed with cataract implant surgery.  She signed an informed consent and arrangements were made for her outpatient admission.  PAST MEDICAL HISTORY:  Patient continues under the care of her regular physician in [place], Dr. Elesa Hacker.  She is felt to be in satisfactory condition for the proposed surgery.  Dr. Elesa Hacker notes the patients good general health, slight anxiety, stable general cardiorespiratory system.  CURRENT MEDICATIONS:  Norvasc, Prevacid, Actonel, Maxzide, Klonopin, one 81 mg aspirin, and Celexa.  ALLERGIES:  She is said to be allergic to MILK and MSO4 drugs.  She is said to be a low surgical risk.  REVIEW OF SYSTEMS:  No cardiorespiratory complaints.  PHYSICAL EXAMINATION:  VITAL SIGNS:  Heart rate 85, respirations  18, temperature 97.5, blood pressure 148/62.  GENERAL APPEARANCE:  Patient is a pleasant, well-nourished, well-developed 77 year old white female in no acute distress.  HEENT:  Eyes:  Visual acuity is noted above.  Applanation tonometry:  Normal. Slight lamp examination:  Nuclear cataract formation both eyes, right greater than left.  Fundus examination:  Clear vitreous, attached retina, normal optic nerve, blood vessels, and macula.  CHEST:  Lungs clear to percussion and auscultation.  HEART:  Normal sinus rhythm, no cardiomegaly, no murmurs.  ABDOMEN:  Negative.  EXTREMITIES:  Negative.  ADMISSION DIAGNOSIS:  Senile nuclear cataract, both eyes.  SURGICAL PLAN:  Cataract implant surgery - right eye now, left eye later. Patient is to stay in 23-hour observation unit as she lives alone and has no one to help her with her medications. Dictated by:   Guadelupe Sabin, M.D. Attending Physician:  Ivor Messier DD:  06/07/01 TD:  06/07/01 Job: [id number]

## 2010-10-03 NOTE — Discharge Summary (Signed)
[place]. [facility]  Patient:    CHAYLEE, EHRSAM Visit Number: [id number] MRN: [id number]          Service Type: DSU Location: 5700 5713 01 Attending Physician:  Ivor Messier Dictated by:   Guadelupe Sabin, M.D. Admit Date:  06/07/2001 Discharge Date: 06/08/2001                             Discharge Summary  This was a planned outpatient surgical admission of a 77 year old white female admitted for cataract implant of the right eye.  HOSPITAL COURSE:  The patient was felt to be in satisfactory condition for the proposed surgery.  She therefore was taken into the operating room where a planned extracapsular cataract extraction with primary insertion of a posterior chamber intraocular lens implant of the right eye was inserted under local anesthesia.  The patient tolerated the 45 minute period of surgery well and was then taken to the recovery room and subsequently to the 23 hour observation unit.  The patient because she lived alone and was unable to use the eyedrops safely home on the first day of surgery.  The patient was begun on topical Tobradex and Trusopt ophthalmic solution. The patient was seen on the evening of surgery and the following morning.  The following morning, slit lamp examination revealed a clear cornea, deep and clear anterior chamber.  The pupil was round and reactive and applanation tonometry 16 mm.  The patient was given a printed list of discharge instructions on the care and use of the operated eye at home.  DISCHARGE OCULAR MEDICATIONS:  Tobradex ophthalmic solution 1 drop every two hours for seven more days.  FOLLOW-UP:  Appointment my office three to five days.  CONDITION AT DISCHARGE:  Improved.  DISCHARGE DIAGNOSES:  Senile cataract, right eye. Dictated by:   Guadelupe Sabin, M.D. Attending Physician:  Ivor Messier DD:  06/26/01 TD:  06/27/01 Job: [id number]

## 2010-10-03 NOTE — Op Note (Signed)
[place]. [facility]  Patient:    Kathy Avery, Kathy Avery Visit Number: [id number] MRN: [id number]          Service Type: DSU Location: 5700 5713 01 Attending Physician:  Ivor Messier Dictated by:   Guadelupe Sabin, M.D. Proc. Date: 06/07/01 Admit Date:  06/07/2001                             Operative Report  PREOPERATIVE DIAGNOSIS:  Senile cataract, right eye.  POSTOPERATIVE DIAGNOSIS:  Senile cataract, right eye.  NAME OF OPERATION:  Planned extracapsular cataract extraction - phacoemulsification, primary insertion of posterior chamber intraocular lens implant.  SURGEON:  Guadelupe Sabin, M.D.  ASSISTANT:  Nurse.  ANESTHESIA:  Local 4% Xylocaine, 0.75% Marcaine retrobulbar block, topical tetracaine, intraocular Xylocaine.   Anesthesia standby required.  Patient given Ditropan intravenously during the period of retrobulbar blocking.  OPERATIVE PROCEDURE:  After the patient was prepped and draped a speculum was inserted in the right eye.  The eye was turned downward and a superior rectus traction suture placed.  Schiotz tonometry was recorded at 7-8 scale units with a 5.5 g weight.  A peritomy was performed adjacent to the limbus from the 11 to 1 oclock position.  The corneoscleral junction was cleaned and a corneoscleral groove made with a 45 degree Superblade.  The anterior chamber was then entered with the 2.5 mm Diamond keratome at the 12 oclock position, and the 15 degree blade at the 2:30 position.  Using a bent 26 gauge needle on a Healon syringe, a circular capsulorrhexis was begun and then completed with the Grabow forceps.  Hydrodissection and hydrodelineation were performed using 1% Xylocaine.  The 30-degree phacoemulsification tip was then inserted and the nucleus flipped vertically into the anterior chamber.  Slow phacoemulsification was achieved and the nucleus aspirated.  Total ultrasonic time - 1 minute 13 seconds, average  power level - 19%, total amount of fluid used - 75 cc.  Following removal of the nucleus, the residual cortex was aspirated with irrigation aspiration tip.  The posterior capsule appeared intact with brilliant red fundus reflex.  It was therefore elected to insert an [employer] SI40NB three-piece silicon posterior chamber intraocular lens with UV absorber, diopter strength +20.50.  This was inserted with the McDonald forceps into the anterior chamber and then centered into the capsular bag using the [facility] lens rotator.  The lens appeared to be well centered.  The Healon which had been used during the procedure was then aspirated and replaced with balanced salt solution and Miochol ophthalmic solution.  The operative incisions appeared to be self-sealing and no sutures were required.  Maxitrol ointment was instilled in the conjunctival cul-de-sac and a light patch and protector shield applied.  Duration of procedure and anesthesia administration - 45 minutes.  Patient tolerated the procedure well in general, left the operating room for the recovery room and subsequently to the 23-hour observation unit. Dictated by:   Guadelupe Sabin, M.D. Attending Physician:  Ivor Messier DD:  06/07/01 TD:  06/07/01 Job: [id number]

## 2010-10-03 NOTE — Op Note (Signed)
[place]. [facility]  Patient:    Kathy Avery, Kathy Avery Visit Number: [id number] MRN: [id number]          Service Type: DSU Location: 5700 5713 01 Attending Physician:  Ivor Messier Dictated by:   Guadelupe Sabin, M.D. Proc. Date: 06/07/01 Admit Date:  06/07/2001 Discharge Date: 06/08/2001                             Operative Report  PREOPERATIVE DIAGNOSIS:  Senile cataract, right eye.  POSTOPERATIVE DIAGNOSIS:  Senile cataract, right eye.  OPERATION PERFORMED:  Planned extracapsular cataract extraction--phacoemulsification, primary insertion of posterior chamber intraocular lens implant.  SURGEON:  Guadelupe Sabin, M.D.  ASSISTANT:  Nurse.  ANESTHESIA:  Local 4% Xylocaine, 0.75% Marcaine.  Anesthesia standby required. Patient given sodium pentothal intravenously during the period of retrobulbar injection.  DESCRIPTION OF PROCEDURE:  After the patient was prepped and draped, the lid speculum was inserted in the left eye.  A Schiotz tonometry was recorded at a safe scale reading 5 to 7 scale units with a 5.5 gm weight.  A peritomy was performed adjacent to the limbus.  This was of the cataract of the right eye. An incision was then made into the anterior chamber with the 2.5 mm keratome at the 12 oclock position and the 15 degree blade at the 2:30 position. Using a bent 26 gauge needle and a Healon syringe, a circular capsulorrhexis was begun and then completed with the Grabo forceps.  Hydrodissection and hydrodelineation were performed using 1% Xylocaine.  A 30 degree phacoemulsification tip was then inserted with slow controlled emulsification of the lens nucleus.  Following removal of the nucleus, the residual cortex was aspirated with the irrigation and aspiration tip.  Total ultrasonic time was one minute 13 seconds, average power level 19%, total amount of fluid used 74 cc.  The posterior capsule appeared intact with a brilliant red  fundus reflex.  It was therefore elected to insert an Aller[employer] medical optics SI40NB three piece silicone posterior chamber intraocular lens implant with UV absorber.  Diopter strength +20.50.  This was inserted with the McDonald forceps into the anterior chamber and then centered into the capsular back using the [facility] lens rotator.  The lens appeared to be well centered.  The Healon which had been used during the procedure was aspirated and replaced with balanced salt solution and Miochol ophthalmic solution.  The operative incisions appeared to be self-sealing and no sutures were required.  Maxitrol ointment was instilled in the conjunctival cul-de-sac and a light patch and protector shield applied.  Duration of procedure and anesthesia administration: 45 minutes.  The patient tolerated the procedure well in general and was then taken to the recovery room and subsequently to the 23 hour observation unit. Dictated by:   Guadelupe Sabin, M.D. Attending Physician:  Ivor Messier DD:  06/26/01 TD:  06/27/01 Job: [id number]

## 2010-10-21 ENCOUNTER — Emergency Department (HOSPITAL_COMMUNITY): Payer: Medicare Other

## 2010-10-21 ENCOUNTER — Emergency Department (HOSPITAL_COMMUNITY)
Admission: EM | Admit: 2010-10-21 | Discharge: 2010-10-21 | Disposition: A | Discharge status: Home / Self Care | Payer: Medicare Other | Attending: Emergency Medicine | Admitting: Emergency Medicine

## 2010-10-21 DIAGNOSIS — R071 Chest pain on breathing: Secondary | ICD-10-CM | POA: Insufficient documentation

## 2010-10-21 DIAGNOSIS — G309 Alzheimer's disease, unspecified: Secondary | ICD-10-CM | POA: Insufficient documentation

## 2010-10-21 DIAGNOSIS — F0280 Dementia in other diseases classified elsewhere without behavioral disturbance: Secondary | ICD-10-CM | POA: Insufficient documentation

## 2010-10-21 DIAGNOSIS — E785 Hyperlipidemia, unspecified: Secondary | ICD-10-CM | POA: Insufficient documentation

## 2010-10-21 LAB — CBC
Hemoglobin: 12.9 g/dL (ref 12.0–15.0)
MCH: 30 pg (ref 26.0–34.0)
RBC: 4.3 MIL/uL (ref 3.87–5.11)
WBC: 17.4 10*3/uL — ABNORMAL HIGH (ref 4.0–10.5)

## 2010-10-21 LAB — DIFFERENTIAL
Basophils Relative: 0 % (ref 0–1)
Lymphs Abs: 1.4 10*3/uL (ref 0.7–4.0)
Monocytes Relative: 7 % (ref 3–12)
Neutro Abs: 14.8 10*3/uL — ABNORMAL HIGH (ref 1.7–7.7)
Neutrophils Relative %: 85 % — ABNORMAL HIGH (ref 43–77)

## 2010-10-21 LAB — BASIC METABOLIC PANEL
GFR calc non Af Amer: 53 mL/min — ABNORMAL LOW (ref >60)
Potassium: 4 mEq/L (ref 3.5–5.1)
Sodium: 142 mEq/L (ref 135–145)

## 2010-10-21 LAB — TROPONIN I: Troponin I: <0.3 ng/mL (ref <0.30)

## 2010-11-23 ENCOUNTER — Encounter: Payer: Self-pay | Admitting: *Deleted

## 2010-11-23 ENCOUNTER — Emergency Department (HOSPITAL_COMMUNITY): Payer: Medicare Other

## 2010-11-23 ENCOUNTER — Emergency Department (HOSPITAL_COMMUNITY)
Admission: EM | Admit: 2010-11-23 | Discharge: 2010-11-23 | Disposition: A | Discharge status: Home / Self Care | Payer: Medicare Other | Attending: Emergency Medicine | Admitting: Emergency Medicine

## 2010-11-23 ENCOUNTER — Other Ambulatory Visit: Payer: Self-pay

## 2010-11-23 DIAGNOSIS — I10 Essential (primary) hypertension: Secondary | ICD-10-CM | POA: Insufficient documentation

## 2010-11-23 DIAGNOSIS — G309 Alzheimer's disease, unspecified: Secondary | ICD-10-CM | POA: Insufficient documentation

## 2010-11-23 DIAGNOSIS — F341 Dysthymic disorder: Secondary | ICD-10-CM | POA: Insufficient documentation

## 2010-11-23 DIAGNOSIS — F0280 Dementia in other diseases classified elsewhere without behavioral disturbance: Secondary | ICD-10-CM | POA: Insufficient documentation

## 2010-11-23 DIAGNOSIS — R55 Syncope and collapse: Secondary | ICD-10-CM | POA: Insufficient documentation

## 2010-11-23 HISTORY — DX: Essential (primary) hypertension: I10

## 2010-11-23 HISTORY — DX: Hyperlipidemia, unspecified: E78.5

## 2010-11-23 HISTORY — DX: Gastro-esophageal reflux disease without esophagitis: K21.9

## 2010-11-23 HISTORY — DX: Dementia in other diseases classified elsewhere, unspecified severity, without behavioral disturbance, psychotic disturbance, mood disturbance, and anxiety: F02.80

## 2010-11-23 HISTORY — DX: Anxiety disorder, unspecified: F41.9

## 2010-11-23 HISTORY — DX: Alzheimer's disease, unspecified: G30.9

## 2010-11-23 HISTORY — DX: Major depressive disorder, single episode, unspecified: F32.9

## 2010-11-23 HISTORY — DX: Depression, unspecified: F32.A

## 2010-11-23 LAB — CBC
HCT: 38.7 % (ref 36.0–46.0)
Hemoglobin: 12.6 g/dL (ref 12.0–15.0)
RBC: 4.1 MIL/uL (ref 3.87–5.11)
WBC: 4.9 10*3/uL (ref 4.0–10.5)

## 2010-11-23 LAB — URINALYSIS, ROUTINE W REFLEX MICROSCOPIC
Glucose, UA: NEGATIVE mg/dL
Ketones, ur: NEGATIVE mg/dL
Leukocytes, UA: NEGATIVE
Specific Gravity, Urine: 1.01 (ref 1.005–1.030)
pH: 6 (ref 5.0–8.0)

## 2010-11-23 LAB — BASIC METABOLIC PANEL
BUN: 28 mg/dL — ABNORMAL HIGH (ref 6–23)
CO2: 30 mEq/L (ref 19–32)
Chloride: 101 mEq/L (ref 96–112)
Creatinine, Ser: 1.09 mg/dL (ref 0.50–1.10)

## 2010-11-23 LAB — DIFFERENTIAL
Lymphocytes Relative: 22 % (ref 12–46)
Monocytes Absolute: 0.4 10*3/uL (ref 0.1–1.0)
Monocytes Relative: 9 % (ref 3–12)
Neutro Abs: 3.4 10*3/uL (ref 1.7–7.7)

## 2010-11-23 MED ORDER — SODIUM CHLORIDE 0.9 % IV BOLUS (SEPSIS)
500.0000 mL | Freq: Once | INTRAVENOUS | Status: AC
  Administered 2010-11-23: 500 mL via INTRAVENOUS
  Filled 2010-11-23: qty 500

## 2010-11-23 NOTE — ED Notes (Signed)
Pt being discharged to home. [employer] called to come and pick up pt. Family at bedside.

## 2010-11-23 NOTE — ED Notes (Signed)
Pt alert and oriented to place and time. Pt confused but at baseline. Pt showing NSR on CCM. Moves all extremities but c/o pain in left ankle with movement. Respirations slow and deep. Breath sounds clear and equal bilaterally. No acute distress.

## 2010-11-23 NOTE — ED Notes (Signed)
Pt back from x-ray. Resting comfortably on stretcher. CCM showing NSR. Family at bedside. No c/o pain at present.

## 2010-11-23 NOTE — ED Notes (Signed)
Family at bedside. 

## 2010-11-23 NOTE — ED Provider Notes (Signed)
History     Chief Complaint  Patient presents with  . Loss of Consciousness   Patient is a 77 y.o. female presenting with syncope. The history is provided by a relative. No language interpreter was used.  Loss of Consciousness This is a new problem. Episode onset: today. The problem occurs rarely. The problem has been resolved. Pertinent negatives include no chest pain, no abdominal pain, no headaches and no shortness of breath. The symptoms are aggravated by nothing. The symptoms are relieved by nothing. She has tried nothing for the symptoms.  Per family member, patient with syncopal episode this AM while at nursing home and walking to the bathroom with aid of her walker. Family member states she caught patient when she fell backward. Reports nursing home staff measured BP following syncopal episode at 80/50. Patient reports she currently feels fine except for some minimal dizziness and weakness. Family member reports h/o falls with most recent fall occuring 6-8 weeks ago with residual rib pain patient is still experiencing.   Past Medical History  Diagnosis Date  . Hypertension   . Depression   . Anxiety   . Alzheimer disease   . GERD (gastroesophageal reflux disease)   . Hyperlipidemia     History reviewed. No pertinent past surgical history.  History reviewed. No pertinent family history.  History  Substance Use Topics  . Smoking status: Never Smoker   . Smokeless tobacco: Not on file  . Alcohol Use: No    OB History    Grav Para Term Preterm Abortions TAB SAB Ect Mult Living                  Review of Systems  Constitutional: Negative for appetite change.  HENT:       No head injury  Respiratory: Negative for shortness of breath.   Cardiovascular: Positive for syncope. Negative for chest pain.  Gastrointestinal: Negative for abdominal pain.  Neurological: Positive for dizziness and weakness. Negative for headaches.       Syncope  All other systems reviewed and  are negative.    Physical Exam  BP 135/61  Pulse 70  Temp(Src) 98.1 F (36.7 C) (Oral)  Resp 12  SpO2 93%  Physical Exam  Constitutional: She appears well-developed and well-nourished.  HENT:  Head: Normocephalic and atraumatic.  Eyes: Conjunctivae are normal. Pupils are equal, round, and reactive to light.  Neck: Neck supple.  Cardiovascular: Normal rate, regular rhythm and normal heart sounds.   Pulmonary/Chest: Effort normal and breath sounds normal. She has no wheezes.  Abdominal: Soft. Normal appearance and bowel sounds are normal. She exhibits no distension. There is no tenderness.  Musculoskeletal: She exhibits no edema.       Cervical back: She exhibits no tenderness.       Thoracic back: She exhibits no tenderness.       Lumbar back: She exhibits no tenderness.  Neurological: She is alert. No sensory deficit.  Skin: Skin is warm and dry.  Psychiatric: She has a normal mood and affect. Her behavior is normal.    ED Course  Procedures Recheck- 2:45PM, Discussed decision to admit or d/c home with patient and family member. Family member expresses concern regarding trying to determine cause of syncopal episodes and hypotensive spells. Explained best course of action is to d/c, follow up with PCP and discuss possible work up to determine cause with PCP. Family member and patient agree with this plan of action. Intent to d/c home.   MDM  I personally performed the services described in the documentation, which were scribed in my presence. The recorded information has been reviewed and considered.   Samanthamarie Ezzell B.   Syncope at SNF today, caught by son before falling. He states she has had problems with orthostasis for the last several weeks. PCP has been changing meds. Denies complaint now.    Date: 11/03/2010   Rate: 68  Rhythm: normal sinus rhythm  QRS Axis: normal  Intervals: normal  ST/T Wave abnormalities: nonspecific T wave changes  Conduction  Disutrbances:none  Narrative Interpretation:   Old EKG Reviewed: unchanged compared to 10-21-10  Pt's labs are unremarkable. No orthostasis here. She has had numerous similar episodes in the past. Discussed inpatient vs outpatient management with the son and he agrees with plan for outpatient eval by PCP. Doubt cardiac dysrhythmia as the source of her symptoms.   Tinley Rought B. Bernette Mayers, MD 11/23/10 1517

## 2010-11-23 NOTE — ED Notes (Signed)
Family at bedside. Bolus complete and rate decreased to kvo. Warm blankets given. Pt in nad.

## 2010-11-23 NOTE — ED Notes (Signed)
Advised patient that we needed urine sample - patient stated she could not go to bathroom at this time - advised patient that when she did have to go advise family members and we would assist her.

## 2010-11-23 NOTE — ED Notes (Signed)
ZOX:[id number]<VW> Expected date:<BR> Expected time:<BR> Means of arrival:Ambulance<BR> Comments:<BR> 85 felmale

## 2010-11-23 NOTE — ED Notes (Signed)
Per patient's family she passed out at home but did not fall because her son caught her. Per EMS pt was alert and confused on their arrival. Pt alert and confused. Pt has a history of dementia. Pt c/o pain in her left ankle and is tender to palpation.

## 2010-11-25 LAB — URINE CULTURE
Colony Count: NO GROWTH
Culture  Setup Time: 201207082034

## 2010-12-12 ENCOUNTER — Emergency Department (HOSPITAL_COMMUNITY): Payer: Medicare Other

## 2010-12-12 ENCOUNTER — Emergency Department (HOSPITAL_COMMUNITY)
Admission: EM | Admit: 2010-12-12 | Discharge: 2010-12-12 | Disposition: A | Discharge status: Other Acute Inpatient Hospital | Payer: Medicare Other | Attending: Emergency Medicine | Admitting: Emergency Medicine

## 2010-12-12 ENCOUNTER — Encounter (HOSPITAL_COMMUNITY): Payer: Self-pay | Admitting: *Deleted

## 2010-12-12 DIAGNOSIS — G309 Alzheimer's disease, unspecified: Secondary | ICD-10-CM | POA: Insufficient documentation

## 2010-12-12 DIAGNOSIS — S32509A Unspecified fracture of unspecified pubis, initial encounter for closed fracture: Secondary | ICD-10-CM | POA: Insufficient documentation

## 2010-12-12 DIAGNOSIS — F0280 Dementia in other diseases classified elsewhere without behavioral disturbance: Secondary | ICD-10-CM | POA: Insufficient documentation

## 2010-12-12 DIAGNOSIS — S32599A Other specified fracture of unspecified pubis, initial encounter for closed fracture: Secondary | ICD-10-CM

## 2010-12-12 DIAGNOSIS — W19XXXA Unspecified fall, initial encounter: Secondary | ICD-10-CM | POA: Insufficient documentation

## 2010-12-12 DIAGNOSIS — M549 Dorsalgia, unspecified: Secondary | ICD-10-CM | POA: Insufficient documentation

## 2010-12-12 DIAGNOSIS — R197 Diarrhea, unspecified: Secondary | ICD-10-CM | POA: Insufficient documentation

## 2010-12-12 NOTE — ED Provider Notes (Signed)
History     Chief Complaint  Patient presents with  . Diarrhea  . Back Pain  . Fall   Patient is a 77 y.o. female presenting with fall. The history is provided by the patient and the EMS personnel. The history is limited by the condition of the patient (Alzheimers). No language interpreter was used.  Fall  A level 5 caveat applies due to Alzheimer's. Per EMS, patient brought to for evaluation after patient fell this morning at [facility] facility. Patient denies having a fall today and states she was just brought here to have an evaluation.  C/o right hip pain.  There were reports of diarrhea, but pt denies this at the current time  Patient seen at 1:31 PM   Past Medical History  Diagnosis Date  . Hypertension   . Depression   . Anxiety   . Alzheimer disease   . GERD (gastroesophageal reflux disease)   . Hyperlipidemia     History reviewed. No pertinent past surgical history.  History reviewed. No pertinent family history.  History  Substance Use Topics  . Smoking status: Never Smoker   . Smokeless tobacco: Never Used  . Alcohol Use: No    OB History    Grav Para Term Preterm Abortions TAB SAB Ect Mult Living                  Review of Systems  Unable to perform ROS: Other  All other systems negative except as noted in HPI.   Physical Exam  BP 150/77  Pulse 73  Temp(Src) 98.1 F (36.7 C) (Oral)  Resp 16  Ht 5' 7.5" (1.715 m)  Wt 150 lb (68.04 kg)  BMI 23.15 kg/m2  SpO2 93%  Physical Exam CONSTITUTIONAL: Well developed/well nourished, no signs of injury noted HEAD AND FACE: Normocephalic/atraumatic EYES: EOMI/PERRL ENMT: Mucous membranes moist NECK: supple no meningeal signs SPINE:entire spine nontender, no bruising noted CV: S1/S2 noted, no murmurs/rubs/gallops noted LUNGS: Lungs are clear to auscultation bilaterally, no apparent distress ABDOMEN: soft, nontender, no rebound or guarding GU:no cva tenderness NEURO: Pt is awake/alert,  moves all extremitiesx4, she appears pleasantly demented EXTREMITIES: pulses normal, full ROM, Mild tenderness with ROM at right hip, no deformity SKIN: warm, color normal PSYCH: no abnormalities of mood noted  ED Course  Procedures  MDM Nursing notes reviewed and considered in documentation xrays reviewed and considered - pelvic rami fx  Pt with apparent fall, though appears well, only has mild tenderness with ROM of right hip, will get pelvis film and reassess Pt stable at this time  Pt ambulatory, likely subacute pelvic rami fx.  She has no complaints. She can f/u as outpatient    Date: 12/12/2010  Rate: 72  Rhythm: normal sinus rhythm  QRS Axis: normal  Intervals: normal  ST/T Wave abnormalities: nonspecific ST changes  Conduction Disutrbances:none  Narrative Interpretation:   Old EKG Reviewed: none available    Chart written by Clarita Crane acting as scribe for Joya Gaskins, MD  I personally performed the services described in this documentation, which was scribed in my presence. The recorded information has been reviewed and considered. Joya Gaskins, MD    Joya Gaskins, MD 12/12/10 [phone]

## 2010-12-12 NOTE — ED Notes (Signed)
[employer] called for transport back to facility.  Staff says someone will be here in about 20-30 minutes.  Reuel Boom, informed.

## 2010-12-12 NOTE — ED Notes (Signed)
Pt walked aprox 65ft in hall way

## 2010-12-12 NOTE — ED Notes (Signed)
Pt arrived today via ems called out to [facility] for fall this am. Pt ems reports pt also has had diarrhea this am. Pt c/o pain to butt and lower back. Pt baseline is  confused and does not remember falling or why she is here.

## 2011-01-31 ENCOUNTER — Encounter (HOSPITAL_COMMUNITY): Payer: Self-pay | Admitting: *Deleted

## 2011-01-31 ENCOUNTER — Emergency Department (HOSPITAL_COMMUNITY): Payer: Medicare Other

## 2011-01-31 ENCOUNTER — Emergency Department (HOSPITAL_COMMUNITY)
Admission: EM | Admit: 2011-01-31 | Discharge: 2011-01-31 | Disposition: A | Discharge status: Skilled Nursing Facility | Payer: Medicare Other | Attending: Emergency Medicine | Admitting: Emergency Medicine

## 2011-01-31 DIAGNOSIS — W19XXXA Unspecified fall, initial encounter: Secondary | ICD-10-CM

## 2011-01-31 DIAGNOSIS — F0280 Dementia in other diseases classified elsewhere without behavioral disturbance: Secondary | ICD-10-CM | POA: Insufficient documentation

## 2011-01-31 DIAGNOSIS — K219 Gastro-esophageal reflux disease without esophagitis: Secondary | ICD-10-CM | POA: Insufficient documentation

## 2011-01-31 DIAGNOSIS — F329 Major depressive disorder, single episode, unspecified: Secondary | ICD-10-CM | POA: Insufficient documentation

## 2011-01-31 DIAGNOSIS — I10 Essential (primary) hypertension: Secondary | ICD-10-CM | POA: Insufficient documentation

## 2011-01-31 DIAGNOSIS — F411 Generalized anxiety disorder: Secondary | ICD-10-CM | POA: Insufficient documentation

## 2011-01-31 DIAGNOSIS — E785 Hyperlipidemia, unspecified: Secondary | ICD-10-CM | POA: Insufficient documentation

## 2011-01-31 DIAGNOSIS — G309 Alzheimer's disease, unspecified: Secondary | ICD-10-CM | POA: Insufficient documentation

## 2011-01-31 DIAGNOSIS — M25559 Pain in unspecified hip: Secondary | ICD-10-CM | POA: Insufficient documentation

## 2011-01-31 DIAGNOSIS — Z79899 Other long term (current) drug therapy: Secondary | ICD-10-CM | POA: Insufficient documentation

## 2011-01-31 DIAGNOSIS — F3289 Other specified depressive episodes: Secondary | ICD-10-CM | POA: Insufficient documentation

## 2011-01-31 DIAGNOSIS — F0390 Unspecified dementia without behavioral disturbance: Secondary | ICD-10-CM

## 2011-01-31 DIAGNOSIS — Z96649 Presence of unspecified artificial hip joint: Secondary | ICD-10-CM | POA: Insufficient documentation

## 2011-01-31 DIAGNOSIS — M79609 Pain in unspecified limb: Secondary | ICD-10-CM | POA: Insufficient documentation

## 2011-01-31 NOTE — ED Notes (Signed)
Fell at [facility] from a standing position, reported that pt c/o bilat. Hip pain and rt arm pain

## 2011-01-31 NOTE — ED Provider Notes (Signed)
History     CSN: [id number] Arrival date & time: 01/31/2011  8:21 PM Scribed for Donnetta Hutching, MD, the patient was seen in room APA03/APA03. This chart was scribed by [name]. This patient's care was started at 9:12PM.   Chief Complaint  Patient presents with  . Fall      HPI Kathy Avery is a 77 y.o. female who presents to the Emergency Department complaining of fall with associated bilateral hip pain and right arm pain that occurred tonight.  Kathy Avery fell from a standing position at [facility]. Patient resides at [facility].  Patient's history is supplemented by her son.  Son notes that the patient suffered a stress fracture 2-3 months ago.   Patient has Alzheimer's disease. A level 5 caveat applies due to dementia.   PAST MEDICAL HISTORY:  Past Medical History  Diagnosis Date  . Hypertension   . Depression   . Anxiety   . Alzheimer disease   . GERD (gastroesophageal reflux disease)   . Hyperlipidemia     PAST SURGICAL HISTORY:  History reviewed. No pertinent past surgical history.  MEDICATIONS:  Previous Medications   ACETAMINOPHEN (TYLENOL) 500 MG TABLET    Take 1,000 mg by mouth 2 (two) times daily.     CHOLECALCIFEROL (VITAMIN D) 1000 UNITS CAPSULE    Take 1,000 Units by mouth daily.     CITALOPRAM (CELEXA) 40 MG TABLET    Take 40 mg by mouth daily.     CLONAZEPAM (KLONOPIN) 0.5 MG TABLET    Take 0.25 mg by mouth. 1/2 tab at 2 pm and bedtime    FUROSEMIDE (LASIX) 20 MG TABLET    Take 20 mg by mouth daily.     MECLIZINE (ANTIVERT) 25 MG TABLET    Take 25 mg by mouth every 6 (six) hours as needed. for dizziness.   MEMANTINE (NAMENDA) 5 MG TABLET    Take 5 mg by mouth 2 (two) times daily.     NAPROXEN (NAPROSYN) 250 MG TABLET    Take 250 mg by mouth 2 (two) times daily as needed. For pain   OMEPRAZOLE (PRILOSEC) 20 MG CAPSULE    Take 20 mg by mouth daily.     POLYETHYLENE GLYCOL (MIRALAX / GLYCOLAX) PACKET    Take 17 g by mouth daily.     PROMETHAZINE (PHENERGAN) 12.5 MG TABLET    Take 12.5 mg by mouth every 6 (six) hours as needed. For nausea   RANITIDINE (ZANTAC) 150 MG CAPSULE    Take 150 mg by mouth every evening.     RIVASTIGMINE (EXELON) 9.5 MG/24HR    Place 1 patch onto the skin daily.     ROSUVASTATIN (CRESTOR) 10 MG TABLET    Take 10 mg by mouth daily.     UNABLE TO FIND    Take by mouth 1 day or 1 dose. Med Name: Calcium 600 mg + D once daily    VITAMIN C (ASCORBIC ACID) 500 MG TABLET    Take 1,000 mg by mouth daily.      ALLERGIES:  Allergies as of 01/31/2011 - Review Complete 01/31/2011  Allergen Reaction Noted  . Morphine and related  11/23/2010     FAMILY HISTORY:  History reviewed. No pertinent family history.   SOCIAL HISTORY: History   Social History  . Marital Status: Widowed    Spouse Name: N/A    Number of Children: N/A  . Years of Education: N/A   Social History Main  Topics  . Smoking status: Never Smoker   . Smokeless tobacco: Never Used  . Alcohol Use: No  . Drug Use: No  . Sexually Active: No   Other Topics Concern  . None   Social History Narrative  . None    Review of Systems  Unable to perform ROS: Dementia     Physical Exam    BP 167/61  Pulse 76  Temp(Src) 97.9 F (36.6 C) (Oral)  Resp 20  Ht 5\' 7"  (1.702 m)  Wt 150 lb (68.04 kg)  BMI 23.49 kg/m2  SpO2 95%  Physical Exam  Nursing note and vitals reviewed. Constitutional: She is oriented to person, place, and time. No distress.       Appearance consistent with age of record  HENT:  Head: Normocephalic and atraumatic.  Eyes: Conjunctivae are normal.  Neck: Neck supple.  Cardiovascular: Normal rate and regular rhythm.  Exam reveals no gallop and no friction rub.   No murmur heard. Pulmonary/Chest: Effort normal and breath sounds normal. No respiratory distress. She has no wheezes. She has no rhonchi. She has no rales. She exhibits no tenderness.  Abdominal: Soft. There is no tenderness.  Musculoskeletal:  Normal range of motion. She exhibits no tenderness.       Normal appearance of extremities  Neurological: She is alert and oriented to person, place, and time. No sensory deficit.  Skin: No rash noted.       Color normal  Psychiatric: She has a normal mood and affect.    ED Course  Procedures    OTHER DATA REVIEWED: Nursing notes, vital signs, and past medical records reviewed.   DIAGNOSTIC STUDIES: Oxygen Saturation is 95% on room air, normal by my interpretation.     LABS / RADIOLOGY:  No results found.  No results found.    ED COURSE / COORDINATION OF CARE:  9:16PM.  No pain or tenderness noted during physical exam.   Will order XR of pelvis.  If no fracture indicated plan to discharge patient to [facility].  Son agrees.    Orders Placed This Encounter  Procedures  . DG Pelvis 1-2 Views    MDM: Patient has dementia. Son says she looks normal to him. He allegedly fell tonight. No obvious bony tenderness will x-ray pelvis secondary to previous bilateral total hip replacements     MEDICATIONS GIVEN IN THE E.D. Scheduled Meds:   Continuous Infusions:      DISCHARGE MEDICATIONS: New Prescriptions   No medications on file      I personally performed the services described in this documentation, which was scribed in my presence. The recorded information has been reviewed and considered. Donnetta Hutching, MD     Donnetta Hutching, MD 01/31/11 [phone]

## 2011-01-31 NOTE — ED Notes (Signed)
Patient's son in room with patient at this time

## 2011-01-31 NOTE — ED Notes (Signed)
Report called to Fleet Contras at [facility], was told that transportation will be here for pt pickup in 30 min.

## 2011-01-31 NOTE — ED Notes (Signed)
Patient fell at [facility] from a standing position, denies pain at this time, no bruising noted, reported that pt c/o bilat. Hip pain and rt arm pain, patient has Alzheimers dz

## 2011-02-28 ENCOUNTER — Encounter (HOSPITAL_COMMUNITY): Payer: Self-pay | Admitting: *Deleted

## 2011-02-28 ENCOUNTER — Emergency Department (HOSPITAL_COMMUNITY): Payer: Medicare Other

## 2011-02-28 ENCOUNTER — Other Ambulatory Visit: Payer: Self-pay

## 2011-02-28 ENCOUNTER — Inpatient Hospital Stay (HOSPITAL_COMMUNITY)
Admission: EM | Admit: 2011-02-28 | Discharge: 2011-03-02 | DRG: 562 | Disposition: A | Discharge status: Nursing Home (Includes ICF) | Payer: Medicare Other | Attending: Internal Medicine | Admitting: Internal Medicine

## 2011-02-28 DIAGNOSIS — F32A Depression, unspecified: Secondary | ICD-10-CM | POA: Diagnosis present

## 2011-02-28 DIAGNOSIS — F068 Other specified mental disorders due to known physiological condition: Secondary | ICD-10-CM | POA: Diagnosis present

## 2011-02-28 DIAGNOSIS — I10 Essential (primary) hypertension: Secondary | ICD-10-CM | POA: Diagnosis present

## 2011-02-28 DIAGNOSIS — F419 Anxiety disorder, unspecified: Secondary | ICD-10-CM | POA: Diagnosis present

## 2011-02-28 DIAGNOSIS — S32409A Unspecified fracture of unspecified acetabulum, initial encounter for closed fracture: Secondary | ICD-10-CM | POA: Diagnosis present

## 2011-02-28 DIAGNOSIS — G309 Alzheimer's disease, unspecified: Secondary | ICD-10-CM | POA: Diagnosis present

## 2011-02-28 DIAGNOSIS — K219 Gastro-esophageal reflux disease without esophagitis: Secondary | ICD-10-CM | POA: Diagnosis present

## 2011-02-28 DIAGNOSIS — S42002A Fracture of unspecified part of left clavicle, initial encounter for closed fracture: Secondary | ICD-10-CM

## 2011-02-28 DIAGNOSIS — F411 Generalized anxiety disorder: Secondary | ICD-10-CM | POA: Diagnosis present

## 2011-02-28 DIAGNOSIS — F329 Major depressive disorder, single episode, unspecified: Secondary | ICD-10-CM | POA: Diagnosis present

## 2011-02-28 DIAGNOSIS — F3289 Other specified depressive episodes: Secondary | ICD-10-CM | POA: Diagnosis present

## 2011-02-28 DIAGNOSIS — Z85038 Personal history of other malignant neoplasm of large intestine: Secondary | ICD-10-CM

## 2011-02-28 DIAGNOSIS — Y9210 Unspecified residential institution as the place of occurrence of the external cause: Secondary | ICD-10-CM | POA: Diagnosis present

## 2011-02-28 DIAGNOSIS — M810 Age-related osteoporosis without current pathological fracture: Secondary | ICD-10-CM | POA: Diagnosis present

## 2011-02-28 DIAGNOSIS — M25552 Pain in left hip: Secondary | ICD-10-CM

## 2011-02-28 DIAGNOSIS — W19XXXA Unspecified fall, initial encounter: Secondary | ICD-10-CM | POA: Diagnosis present

## 2011-02-28 DIAGNOSIS — R5381 Other malaise: Secondary | ICD-10-CM | POA: Diagnosis present

## 2011-02-28 DIAGNOSIS — S42009A Fracture of unspecified part of unspecified clavicle, initial encounter for closed fracture: Principal | ICD-10-CM | POA: Diagnosis present

## 2011-02-28 DIAGNOSIS — F0390 Unspecified dementia without behavioral disturbance: Secondary | ICD-10-CM

## 2011-02-28 DIAGNOSIS — F0280 Dementia in other diseases classified elsewhere without behavioral disturbance: Secondary | ICD-10-CM | POA: Diagnosis present

## 2011-02-28 DIAGNOSIS — E785 Hyperlipidemia, unspecified: Secondary | ICD-10-CM | POA: Diagnosis present

## 2011-02-28 HISTORY — DX: Fracture of unspecified parts of lumbosacral spine and pelvis, initial encounter for closed fracture: S32.9XXA

## 2011-02-28 LAB — BASIC METABOLIC PANEL
CO2: 29 mEq/L (ref 19–32)
Calcium: 8.9 mg/dL (ref 8.4–10.5)
Creatinine, Ser: 0.86 mg/dL (ref 0.50–1.10)
GFR calc non Af Amer: 60 mL/min — ABNORMAL LOW (ref >90)

## 2011-02-28 LAB — DIFFERENTIAL
Basophils Absolute: 0 10*3/uL (ref 0.0–0.1)
Eosinophils Absolute: 0 10*3/uL (ref 0.0–0.7)
Eosinophils Relative: 1 % (ref 0–5)
Lymphocytes Relative: 17 % (ref 12–46)
Monocytes Absolute: 0.7 10*3/uL (ref 0.1–1.0)

## 2011-02-28 LAB — URINALYSIS, ROUTINE W REFLEX MICROSCOPIC
Bilirubin Urine: NEGATIVE
Hgb urine dipstick: NEGATIVE
Specific Gravity, Urine: 1.015 (ref 1.005–1.030)
Urobilinogen, UA: 0.2 mg/dL (ref 0.0–1.0)
pH: 7 (ref 5.0–8.0)

## 2011-02-28 LAB — CBC
HCT: 40.3 % (ref 36.0–46.0)
MCH: 31.1 pg (ref 26.0–34.0)
MCHC: 32.3 g/dL (ref 30.0–36.0)
MCV: 96.4 fL (ref 78.0–100.0)
RDW: 12.7 % (ref 11.5–15.5)
WBC: 6.2 10*3/uL (ref 4.0–10.5)

## 2011-02-28 LAB — PROTIME-INR: Prothrombin Time: 13.3 seconds (ref 11.6–15.2)

## 2011-02-28 MED ORDER — MECLIZINE HCL 12.5 MG PO TABS: 25.0000 mg | ORAL_TABLET | Freq: Four times a day (QID) | ORAL | Status: DC | PRN

## 2011-02-28 MED ORDER — CITALOPRAM HYDROBROMIDE 20 MG PO TABS
40.0000 mg | ORAL_TABLET | Freq: Every day | ORAL | Status: DC
  Administered 2011-03-01 – 2011-03-02 (×2): 40 mg via ORAL
  Filled 2011-02-28 (×2): qty 2

## 2011-02-28 MED ORDER — POTASSIUM CHLORIDE IN NACL 20-0.9 MEQ/L-% IV SOLN
INTRAVENOUS | Status: DC
  Administered 2011-03-01 (×2): via INTRAVENOUS

## 2011-02-28 MED ORDER — SENNOSIDES-DOCUSATE SODIUM 8.6-50 MG PO TABS: 1.0000 | ORAL_TABLET | Freq: Every day | ORAL | Status: DC | PRN

## 2011-02-28 MED ORDER — ENOXAPARIN SODIUM 30 MG/0.3ML ~~LOC~~ SOLN
30.0000 mg | SUBCUTANEOUS | Status: DC
  Administered 2011-02-28 – 2011-03-01 (×2): 30 mg via SUBCUTANEOUS
  Filled 2011-02-28 (×2): qty 0.3

## 2011-02-28 MED ORDER — HYDROMORPHONE HCL 1 MG/ML IJ SOLN
0.5000 mg | INTRAMUSCULAR | Status: DC | PRN
  Administered 2011-02-28 – 2011-03-01 (×4): 0.5 mg via INTRAVENOUS
  Filled 2011-02-28 (×5): qty 1

## 2011-02-28 MED ORDER — PROMETHAZINE HCL 12.5 MG PO TABS: 12.5000 mg | ORAL_TABLET | Freq: Four times a day (QID) | ORAL | Status: DC | PRN

## 2011-02-28 MED ORDER — FENTANYL CITRATE 0.05 MG/ML IJ SOLN
50.0000 ug | Freq: Once | INTRAMUSCULAR | Status: AC
  Administered 2011-02-28: 50 ug via INTRAVENOUS
  Filled 2011-02-28: qty 2

## 2011-02-28 MED ORDER — POLYETHYLENE GLYCOL 3350 17 G PO PACK
17.0000 g | PACK | Freq: Every day | ORAL | Status: DC
  Administered 2011-03-01 – 2011-03-02 (×2): 17 g via ORAL
  Filled 2011-02-28 (×2): qty 1

## 2011-02-28 MED ORDER — DM-GUAIFENESIN ER 30-600 MG PO TB12
1.0000 | ORAL_TABLET | Freq: Two times a day (BID) | ORAL | Status: DC
  Administered 2011-03-01 – 2011-03-02 (×3): 1 via ORAL
  Filled 2011-02-28 (×8): qty 1

## 2011-02-28 MED ORDER — CLONAZEPAM 0.5 MG PO TABS
0.2500 mg | ORAL_TABLET | Freq: Every day | ORAL | Status: DC
  Administered 2011-03-01 – 2011-03-02 (×2): 0.25 mg via ORAL
  Filled 2011-02-28 (×2): qty 1

## 2011-02-28 MED ORDER — FUROSEMIDE 20 MG PO TABS
20.0000 mg | ORAL_TABLET | Freq: Every day | ORAL | Status: DC
  Administered 2011-03-01 – 2011-03-02 (×2): 20 mg via ORAL
  Filled 2011-02-28 (×2): qty 1

## 2011-02-28 MED ORDER — HYDROMORPHONE HCL 1 MG/ML IJ SOLN
INTRAMUSCULAR | Status: AC
  Administered 2011-02-28
  Filled 2011-02-28: qty 1

## 2011-02-28 MED ORDER — RIVASTIGMINE 9.5 MG/24HR TD PT24
1.0000 | MEDICATED_PATCH | Freq: Every day | TRANSDERMAL | Status: DC
  Administered 2011-03-01 – 2011-03-02 (×2): 9.5 mg via TRANSDERMAL
  Filled 2011-02-28 (×4): qty 1

## 2011-02-28 MED ORDER — MEMANTINE HCL 10 MG PO TABS
5.0000 mg | ORAL_TABLET | Freq: Two times a day (BID) | ORAL | Status: DC
  Administered 2011-02-28 – 2011-03-02 (×4): 5 mg via ORAL
  Filled 2011-02-28 (×4): qty 1

## 2011-02-28 MED ORDER — HYDROCODONE-ACETAMINOPHEN 5-325 MG PO TABS
1.0000 | ORAL_TABLET | ORAL | Status: DC | PRN
  Administered 2011-03-01 – 2011-03-02 (×4): 1 via ORAL
  Filled 2011-02-28 (×4): qty 1

## 2011-02-28 MED ORDER — ACETAMINOPHEN 325 MG PO TABS: 650.0000 mg | ORAL_TABLET | Freq: Four times a day (QID) | ORAL | Status: DC | PRN

## 2011-02-28 MED ORDER — ROSUVASTATIN CALCIUM 20 MG PO TABS
10.0000 mg | ORAL_TABLET | Freq: Every day | ORAL | Status: DC
  Administered 2011-03-01 – 2011-03-02 (×2): 10 mg via ORAL
  Filled 2011-02-28: qty 1
  Filled 2011-02-28: qty 2

## 2011-02-28 MED ORDER — SODIUM CHLORIDE 0.9 % IV SOLN
1000.0000 mL | INTRAVENOUS | Status: AC
  Administered 2011-02-28: 1000 mL via INTRAVENOUS

## 2011-02-28 MED ORDER — ACETAMINOPHEN 650 MG RE SUPP: 650.0000 mg | Freq: Four times a day (QID) | RECTAL | Status: DC | PRN

## 2011-02-28 MED ORDER — PANTOPRAZOLE SODIUM 40 MG PO TBEC
40.0000 mg | DELAYED_RELEASE_TABLET | Freq: Every day | ORAL | Status: DC
  Administered 2011-02-28 – 2011-03-02 (×3): 40 mg via ORAL
  Filled 2011-02-28 (×3): qty 1

## 2011-02-28 MED ORDER — ALUM & MAG HYDROXIDE-SIMETH 200-200-20 MG/5ML PO SUSP: 30.0000 mL | Freq: Four times a day (QID) | ORAL | Status: DC | PRN

## 2011-02-28 NOTE — ED Provider Notes (Signed)
History     CSN: [id number] Arrival date & time: 02/28/2011  2:00 PM  Chief Complaint  Patient presents with  . Fall  . Shoulder Pain  . Arm Pain    (Consider location/radiation/quality/duration/timing/severity/associated sxs/prior treatment) HPI This pleasantly demented 77 year old female arrives from a nursing home after being found on the floor from an apparent fall with left shoulder and left hip pain. The patient does not recall falling whatsoever. She does not know where she is or why she is here. She admits she has moderate left shoulder pain and mild left hip pain but denies headache or neck pain back pain chest pain shortness of breath abdominal pain or pain to her right arm or right leg. She denies any new focal weakness or numbness. Review of systems is otherwise unobtainable due to the patient's dementia.  This was an unwitnessed event. Her pain is localized to the left hip and shoulder without radiation it is a sharp pain which is constant and worse with movement and better she stays still has been constant this afternoon and moderately severe to the left shoulder without associated symptoms or prior treatment. Past Medical History  Diagnosis Date  . Hypertension   . Depression   . Anxiety   . Alzheimer disease   . GERD (gastroesophageal reflux disease)   . Hyperlipidemia   . Pelvic fracture     Past Surgical History  Procedure Date  . Fracture surgery     pelvis    History reviewed. No pertinent family history.  History  Substance Use Topics  . Smoking status: Never Smoker   . Smokeless tobacco: Never Used  . Alcohol Use: No    OB History    Grav Para Term Preterm Abortions TAB SAB Ect Mult Living                  Review of Systems  Unable to perform ROS   Allergies  Morphine and related  Home Medications   Current Outpatient Rx  Name Route Sig Dispense Refill  . ACETAMINOPHEN 500 MG PO TABS Oral Take 1,000 mg by mouth 2 (two) times daily.       Marland Kitchen CALCIUM 600/VITAMIN D PO Oral Take 1 tablet by mouth daily.      Marland Kitchen VITAMIN D 1000 UNITS PO CAPS Oral Take 1,000 Units by mouth daily.      Marland Kitchen CITALOPRAM HYDROBROMIDE 40 MG PO TABS Oral Take 40 mg by mouth daily.     Marland Kitchen CLONAZEPAM 0.5 MG PO TABS Oral Take 0.25 mg by mouth. Patient takes (0.25mg ) at 2pm and Patient takes (0.5mg ) at bedtime    . MEMANTINE HCL 5 MG PO TABS Oral Take 5 mg by mouth 2 (two) times daily.      Marland Kitchen OMEPRAZOLE 20 MG PO CPDR Oral Take 20 mg by mouth daily.      Marland Kitchen POLYETHYLENE GLYCOL 3350 PO PACK Oral Take 17 g by mouth daily.      Marland Kitchen PROMETHAZINE HCL 12.5 MG PO TABS Oral Take 12.5 mg by mouth every 6 (six) hours as needed. For nausea    . RANITIDINE HCL 150 MG PO CAPS Oral Take 150 mg by mouth every evening.      Marland Kitchen RIVASTIGMINE 9.5 MG/24HR TD PT24 Transdermal Place 1 patch onto the skin daily.      Marland Kitchen ROSUVASTATIN CALCIUM 10 MG PO TABS Oral Take 10 mg by mouth daily.      Marland Kitchen VITAMIN C 500 MG PO TABS  Oral Take 1,000 mg by mouth daily.     Marland Kitchen DEXTROMETHORPHAN-GUAIFENESIN 30-600 MG PO TB12 Oral Take 1 tablet by mouth every 12 (twelve) hours.      . FUROSEMIDE 20 MG PO TABS Oral Take 20 mg by mouth daily.      Marland Kitchen HYDROCODONE-ACETAMINOPHEN 5-325 MG PO TABS Oral Take 1 tablet by mouth 3 (three) times daily. 30 tablet 0    Hold for sedation  . MECLIZINE HCL 25 MG PO TABS Oral Take 25 mg by mouth every 6 (six) hours as needed. for dizziness.    Marland Kitchen NAPROXEN 250 MG PO TABS Oral Take 250 mg by mouth 2 (two) times daily as needed. For pain    . UNABLE TO FIND Oral Take by mouth 1 day or 1 dose. Med Name: Calcium 600 mg + D once daily       BP 174/93  Pulse 83  Temp(Src) 98.1 F (36.7 C) (Oral)  Resp 20  Ht 5\' 7"  (1.702 m)  Wt 150 lb (68.04 kg)  BMI 23.49 kg/m2  SpO2 95%  Physical Exam  Nursing note and vitals reviewed. Constitutional:       Awake, alert, nontoxic appearance. Oriented to person only.  HENT:  Head: Atraumatic.  Eyes: Conjunctivae and EOM are normal. Pupils are  equal, round, and reactive to light. Right eye exhibits no discharge. Left eye exhibits no discharge.  Neck: Neck supple.  Cardiovascular: Normal rate and regular rhythm.   No murmur heard. Pulmonary/Chest: Effort normal and breath sounds normal. No respiratory distress. She has no wheezes. She has no rales. She exhibits no tenderness.  Abdominal: Soft. Bowel sounds are normal. There is no tenderness. There is no rebound.  Musculoskeletal: She exhibits tenderness. She exhibits no edema.       Baseline ROM right arm and leg, no obvious new focal weakness.  Left shoulder and left clavicle are tender left upper arm elbow forearm wrist and hand are nontender. Left hip is tender only with active and passive range of motion left knee ankle and foot are nontender she has capillary refill less than 2 seconds in her left hand and left foot with dorsalis pedis pulse intact to her left foot and radial pulses intact to her left wrist. No obvious distortion is or numbness in her left arm or left leg. Her right arm and leg are nontender with good active range of motion.  Lymphadenopathy:    She has no cervical adenopathy.  Neurological: She is alert.       Mental status and motor strength appears baseline for patient and situation.  No localizing numbness. Normal finger to nose testing with right arm. Unable to test finger to nose testing with the left arm and left shoulder pain.  Skin: No rash noted.  Psychiatric: She has a normal mood and affect.    ED Course  Procedures (including critical care time)  The patient cannot sit up or stand after a negative plain films of her pelvis and hip so CT of the pelvis and left femur are ordered.  After CT of pelvis results obtained the case was discussed with [facility] hospitalists who will see the patient in the ED for admission.  ECG: Normal sinus rhythm, rate 69 beats per minute, normal axis, prolonged QT interval, partially inverted anterior T waves, no comparison  ECG available Labs Reviewed  BASIC METABOLIC PANEL - Abnormal; Notable for the following:    Glucose, Bld 126 (*)    GFR calc non  Af Amer 60 (*)    GFR calc Af Amer 69 (*)    All other components within normal limits  URINALYSIS, ROUTINE W REFLEX MICROSCOPIC - Abnormal; Notable for the following:    Appearance HAZY (*)    All other components within normal limits  BASIC METABOLIC PANEL - Abnormal; Notable for the following:    Calcium 8.1 (*)    GFR calc non Af Amer 79 (*)    All other components within normal limits  CBC - Abnormal; Notable for the following:    RBC 3.75 (*)    Hemoglobin 11.7 (*)    Platelets 142 (*)    All other components within normal limits  CBC  DIFFERENTIAL  PROTIME-INR  MRSA PCR SCREENING  LAB REPORT - SCANNED   No results found.   1. Hip pain, left   2. Fall   3. Fracture of clavicle, left, closed   4. Dementia   5. DEMENTIA   6. GERD   7. PERSONAL HX COLON CANCER   8. Clavicular fracture   9. Physical deconditioning   10. Hyperlipidemia   11. Hypertension   12. Depression   13. Anxiety   14. Osteoporosis       MDM  Patient / Family / Caregiver informed of clinical course, understand medical decision-making process, and agree with plan.        Hurman Horn, MD 03/03/11 [phone]

## 2011-02-28 NOTE — ED Notes (Signed)
Pt arrived today via ems today d/t fall at [facility]. Pt c/o left arm and shoulder pain. Pt also states she is having some left hip pain but shoulder is the worst. Pt has shortening to left leg with little rotation. Pt is on back board with neck embolized.

## 2011-02-28 NOTE — H&P (Signed)
PCP:   Dr Dwana Melena  Chief Complaint:  Fall, hip pain  HPI: This is a pleasant 77 y/o female who resides at the [facility] assisted living center in the dementia unit, she is a fall risk, who has had several falls. Today after dinner, she got up to go to bedroom, she was using her cane, when she tripped and fell. She is usually encouraged to use her walker. She c/o hip pain and shoulder pain. She was transported to the Er, where she was found to have a left clavicular fracture. Attempts to ambulate patient was unsuccessful, the hospitalist service was called with a request for admission with social work consult for placement in rehab/SNF. History obtained from ER physician and son who is present at bedside  Review of Systems: unable to obtain d/t patients dementia  Past Medical History: Past Medical History  Diagnosis Date  . Hypertension   . Depression   . Anxiety   . Alzheimer disease   . GERD (gastroesophageal reflux disease)   . Hyperlipidemia   . Pelvic fracture    Past Surgical History  Procedure Date  . Fracture surgery     pelvis    Medications: Prior to Admission medications   Medication Sig Start Date End Date Taking? Authorizing Provider  acetaminophen (TYLENOL) 500 MG tablet Take 1,000 mg by mouth 2 (two) times daily.    Yes Historical Provider, MD  Calcium Carbonate-Vitamin D (CALCIUM 600/VITAMIN D PO) Take 1 tablet by mouth daily.     Yes Historical Provider, MD  Cholecalciferol (VITAMIN D) 1000 UNITS capsule Take 1,000 Units by mouth daily.     Yes Historical Provider, MD  citalopram (CELEXA) 40 MG tablet Take 40 mg by mouth daily.    Yes Historical Provider, MD  clonazePAM (KLONOPIN) 0.5 MG tablet Take 0.25 mg by mouth. Patient takes (0.25mg ) at 2pm and Patient takes (0.5mg ) at bedtime   Yes Historical Provider, MD  memantine (NAMENDA) 5 MG tablet Take 5 mg by mouth 2 (two) times daily.     Yes Historical Provider, MD  omeprazole (PRILOSEC) 20 MG capsule Take  20 mg by mouth daily.     Yes Historical Provider, MD  polyethylene glycol (MIRALAX / GLYCOLAX) packet Take 17 g by mouth daily.     Yes Historical Provider, MD  promethazine (PHENERGAN) 12.5 MG tablet Take 12.5 mg by mouth every 6 (six) hours as needed. For nausea   Yes Historical Provider, MD  ranitidine (ZANTAC) 150 MG capsule Take 150 mg by mouth every evening.     Yes Historical Provider, MD  rivastigmine (EXELON) 9.5 mg/24hr Place 1 patch onto the skin daily.     Yes Historical Provider, MD  rosuvastatin (CRESTOR) 10 MG tablet Take 10 mg by mouth daily.     Yes Historical Provider, MD  vitamin C (ASCORBIC ACID) 500 MG tablet Take 1,000 mg by mouth daily.    Yes Historical Provider, MD  dextromethorphan-guaiFENesin (MUCINEX DM) 30-600 MG per 12 hr tablet Take 1 tablet by mouth every 12 (twelve) hours.      Historical Provider, MD  furosemide (LASIX) 20 MG tablet Take 20 mg by mouth daily.      Historical Provider, MD  meclizine (ANTIVERT) 25 MG tablet Take 25 mg by mouth every 6 (six) hours as needed. for dizziness.    Historical Provider, MD  naproxen (NAPROSYN) 250 MG tablet Take 250 mg by mouth 2 (two) times daily as needed. For pain    Historical  Provider, MD  UNABLE TO FIND Take by mouth 1 day or 1 dose. Med Name: Calcium 600 mg + D once daily     Historical Provider, MD    Allergies:   Allergies  Allergen Reactions  . Morphine And Related     Social History:  reports that she has never smoked. She has never used smokeless tobacco. She reports that she does not drink alcohol or use illicit drugs. Lives at [facility] in dementia unit  Family History: History reviewed. No pertinent family history.  Physical Exam: Filed Vitals:   02/28/11 1348 02/28/11 1945  BP: 157/74 186/80  Pulse: 74 78  Temp: 98.1 F (36.7 C)   TempSrc: Oral   Resp: 18 16  Height: 5\' 7"  (1.702 m)   Weight: 68.04 kg (150 lb)   SpO2: 96% 94%    General: Pleasantly demented female, well developed  and nourished, no acute distress Eyes: PERRLA, pink conjunctiva, scleral icterus ENT: dry oral mucosa, neck supple, no thyromegaly Lungs: clear to ascultation, no wheeze, no crackles, no use of accessory muscles Cardiovascular: regular rate and rhythm, no regurgitation, no gallops, no murmurs. No carotid bruits, no JVD Abdomen: soft, positive BS, non-tender, none distended, no organomegaly, not an acute abdomen GU: not examined Neuro: CN II - XII grossly intact, sensation intact Musculoskeletal: strength 5/5 b/l upper and LL extremity, unable to assess RLE d/t discomfort, no clubbing, cyanosis or edema Skin: no rash, no subcutaneous crepitation, no decubitus Psych: pleasantly demented female patient   Labs on Admission:   Basename 02/28/11 1549  NA 140  K 3.9  CL 104  CO2 29  GLUCOSE 126*  BUN 20  CREATININE 0.86  CALCIUM 8.9  MG --  PHOS --   No results found for this basename: AST:2,ALT:2,ALKPHOS:2,BILITOT:2,PROT:2,ALBUMIN:2 in the last 72 hours No results found for this basename: LIPASE:2,AMYLASE:2 in the last 72 hours  Basename 02/28/11 1420  WBC 6.2  NEUTROABS 4.5  HGB 13.0  HCT 40.3  MCV 96.4  PLT 152   No results found for this basename: CKTOTAL:3,CKMB:3,CKMBINDEX:3,TROPONINI:3 in the last 72 hours No results found for this basename: TSH,T4TOTAL,FREET3,T3FREE,THYROIDAB in the last 72 hours No results found for this basename: VITAMINB12:2,FOLATE:2,FERRITIN:2,TIBC:2,IRON:2,RETICCTPCT:2 in the last 72 hours  Radiological Exams on Admission: Dg Chest 1 View  02/28/2011  *RADIOLOGY REPORT*  Clinical Data: Fall.  Multiple body aches.  CHEST - 1 VIEW  Comparison: 11/23/2010  Findings: The lungs are clear without focal infiltrate, edema, pneumothorax or pleural effusion. The cardiopericardial silhouette is enlarged. Imaged bony structures of the thorax are intact.  IMPRESSION: Stable.  No acute findings.  Original Report Authenticated By: ERIC A. MANSELL, M.D.   Dg  Pelvis 1-2 Views  01/31/2011  *RADIOLOGY REPORT*  Clinical Data: Hip pain post fall  PELVIS - 1-2 VIEW  Comparison: 12/12/2010  Findings: Bilateral hip prostheses. Osseous demineralization. No acute fracture, dislocation or bone destruction. Scattered pelvic phleboliths. No periprosthetic lucency. Tip of femoral component not imaged. Sclerosis identified at an old inferior right pubic ramus fracture.  IMPRESSION: Bilateral hip prostheses. Osseous demineralization. Old right inferior pubic ramus fracture. No acute abnormalities.  Original Report Authenticated By: Lollie Marrow, M.D.   Dg Hip Complete Left  02/28/2011  *RADIOLOGY REPORT*  Clinical Data: .  LEFT HIP - COMPLETE 2+ VIEW  Comparison: 01/31/2011  Findings: AP pelvis with frontal and frog-leg lateral views of the left hip show the patient be status post bilateral hip replacements.  Entire right prosthesis is not  included on this left- sided study.  There is no evidence for left-sided periprosthetic femur fracture.  Bones are diffusely demineralized.  No change in the old right inferior pubic ramus fracture.  Sacrum and SI joints are unremarkable.  IMPRESSION: No acute findings.  Original Report Authenticated By: ERIC A. MANSELL, M.D.   Dg Femur Left  02/28/2011  *RADIOLOGY REPORT*  Clinical Data: Left hip pain, fall  LEFT FEMUR - 2 VIEW  Comparison: Left hip radiographs 02/28/2011  Findings: Osseous demineralization. Joint space narrowing left knee. No acute fracture, dislocation, or bone destruction. Soft tissues unremarkable. Stem of left femoral component of left hip prosthesis noted.  IMPRESSION: Osseous demineralization. Degenerative changes left knee. No definite acute bony abnormalities.  Original Report Authenticated By: Lollie Marrow, M.D.   Ct Head Wo Contrast  02/28/2011  *RADIOLOGY REPORT*  Clinical Data:  Fall.  Altered level of consciousness.  CT HEAD WITHOUT CONTRAST CT CERVICAL SPINE WITHOUT CONTRAST  Technique:  Multidetector  CT imaging of the head and cervical spine was performed following the standard protocol without intravenous contrast.  Multiplanar CT image reconstructions of the cervical spine were also generated.  Comparison:  09/12/2010  CT HEAD  Findings: There is no evidence for acute hemorrhage, hydrocephalus, mass lesion, or abnormal extra-axial fluid collection.  No definite CT evidence for acute infarction.  Diffuse loss of parenchymal volume is consistent with atrophy. Patchy low attenuation in the deep hemispheric and periventricular white matter is nonspecific, but likely reflects chronic microvascular ischemic demyelination. The visualized paranasal sinuses and mastoid air cells are clear.  IMPRESSION: No acute intracranial abnormality.  Atrophy with chronic small vessel white matter ischemic demyelination.  CT CERVICAL SPINE  Findings: Imaging was obtained from the skull base through the T1-2 interspace.  There is no evidence for an acute fracture.  No subluxation.  Loss of disc height is seen at C5-6.  The facets are well-aligned bilaterally.  There is some right-sided facet osteoarthritis at C4-5 and C7-T1.  Normal cervical lordosis is preserved.  There is no prevertebral soft tissue swelling.  IMPRESSION: No acute bony abnormality in the cervical spine.  Original Report Authenticated By: ERIC A. MANSELL, M.D.   Ct Cervical Spine Wo Contrast  02/28/2011  *RADIOLOGY REPORT*  Clinical Data:  Fall.  Altered level of consciousness.  CT HEAD WITHOUT CONTRAST CT CERVICAL SPINE WITHOUT CONTRAST  Technique:  Multidetector CT imaging of the head and cervical spine was performed following the standard protocol without intravenous contrast.  Multiplanar CT image reconstructions of the cervical spine were also generated.  Comparison:  09/12/2010  CT HEAD  Findings: There is no evidence for acute hemorrhage, hydrocephalus, mass lesion, or abnormal extra-axial fluid collection.  No definite CT evidence for acute infarction.   Diffuse loss of parenchymal volume is consistent with atrophy. Patchy low attenuation in the deep hemispheric and periventricular white matter is nonspecific, but likely reflects chronic microvascular ischemic demyelination. The visualized paranasal sinuses and mastoid air cells are clear.  IMPRESSION: No acute intracranial abnormality.  Atrophy with chronic small vessel white matter ischemic demyelination.  CT CERVICAL SPINE  Findings: Imaging was obtained from the skull base through the T1-2 interspace.  There is no evidence for an acute fracture.  No subluxation.  Loss of disc height is seen at C5-6.  The facets are well-aligned bilaterally.  There is some right-sided facet osteoarthritis at C4-5 and C7-T1.  Normal cervical lordosis is preserved.  There is no prevertebral soft tissue swelling.  IMPRESSION: No  acute bony abnormality in the cervical spine.  Original Report Authenticated By: ERIC A. MANSELL, M.D.   Ct Pelvis Wo Contrast  02/28/2011  *RADIOLOGY REPORT*  Clinical Data:  Left hip pain, fall, negative radiographs  CT PELVIS WITHOUT CONTRAST  Technique:  Multidetector CT imaging of the pelvis was performed following the standard protocol without intravenous contrast. Sagittal and coronal MPR images reconstructed from axial data set.  Comparison:  None Correlation:  Left hip radiographs 02/28/2011  Findings: Severe osseous demineralization, limiting exam. Symmetric SI joints. Bilateral hip prostheses with significant beam hardening artifacts. Old right superior and inferior pubic rami fractures with minimal sclerosis. Deformity and sclerosis identified in left sacrum compatible with fracture, suspect subacute to old as well. Subtle irregularity identified at the anterior column of the left acetabulum, questionable nondisplaced fracture. No additional fracture or dislocation seen. Small amount of lucency is identified in the proximal left femur, adjacent to the femoral stem of the left hip prosthesis,  not seen on right, cannot exclude loosening. No additional focal osseous abnormalities identified. Atherosclerotic calcifications noted in pelvis. No definite acute intrapelvic process seen within limitations of exam.  IMPRESSION: Bilateral hip prostheses. Limitations of exam secondary to beam hardening artifacts from hip prostheses and severe degree of demineralization. Subacute to old fractures right pubic rami and suspect left sacrum. Questionable fracture versus artifacts at anterior column left acetabulum.  Original Report Authenticated By: Lollie Marrow, M.D.   Dg Shoulder Left  02/28/2011  *RADIOLOGY REPORT*  Clinical Data: Fall.  Pain.  LEFT SHOULDER - 2+ VIEW  Comparison: None.  Findings: Three-view exam left shoulder shows a fracture of the clavicle near the junction of the middle and distal thirds.  No evidence for shoulder separation or dislocation.  Cortical irregularity at the rotator cuff insertion is consistent with degenerative change.  IMPRESSION: Left clavicle fracture.  Original Report Authenticated By: ERIC A. MANSELL, M.D.    Assessment/Plan Present on Admission:  .Clavicular fracture  Questionable fracture versus artifacts at anterior column left acetabulum .Physical deconditioning -admit with PT consult -ortho consult in AM, likely conservative management -pain control .DEMENTIA .GERD .PERSONAL HX COLON CANCER HTN Hyperlipidemia -stable -resume home meds  Code status: full code DVt prophylaxis Team 1  Leena Tiede 02/28/2011, 8:22 PM

## 2011-03-01 LAB — MRSA PCR SCREENING: MRSA by PCR: NEGATIVE

## 2011-03-01 LAB — BASIC METABOLIC PANEL
BUN: 14 mg/dL (ref 6–23)
CO2: 27 mEq/L (ref 19–32)
Calcium: 8.1 mg/dL — ABNORMAL LOW (ref 8.4–10.5)
Chloride: 106 mEq/L (ref 96–112)
Creatinine, Ser: 0.65 mg/dL (ref 0.50–1.10)
GFR calc Af Amer: >90 mL/min (ref >90)

## 2011-03-01 LAB — CBC
HCT: 36.1 % (ref 36.0–46.0)
MCHC: 32.4 g/dL (ref 30.0–36.0)
MCV: 96.3 fL (ref 78.0–100.0)
Platelets: 142 10*3/uL — ABNORMAL LOW (ref 150–400)
RDW: 12.7 % (ref 11.5–15.5)
WBC: 6.7 10*3/uL (ref 4.0–10.5)

## 2011-03-01 NOTE — Progress Notes (Signed)
Chart reviewed.  Subjective: Complaining of left shoulder pain and left hip pain.  Objective: Vital signs in last 24 hours: Filed Vitals:   02/28/11 1348 02/28/11 1945 03/01/11 0618  BP: 157/74 186/80 136/80  Pulse: 74 78 82  Temp: 98.1 F (36.7 C)  98.2 F (36.8 C)  TempSrc: Oral  Oral  Resp: 18 16 18   Height: 5\' 7"  (1.702 m)    Weight: 68.04 kg (150 lb)    SpO2: 96% 94% 95%   Weight change:   Intake/Output Summary (Last 24 hours) at 03/01/11 1014 Last data filed at 03/01/11 0700  Gross per 24 hour  Intake      0 ml  Output    900 ml  Net   -900 ml   Gen.: Asleep. Arousable. Forgetful but cooperative. Lungs clear to auscultation bilaterally without wheezes rhonchi or rales Cardiovascular regular rate rhythm without murmurs gallops rubs Abdomen soft nontender nondistended extremities no clubbing cyanosis or edema Musculoskeletal: Swelling and tenderness about the left clavicular area. Her left arm is in a sling. Left hip area is tender. No bruising.  Lab Results: Basic Metabolic Panel:  Lab 03/01/11 1610 02/28/11 1549  NA 139 140  K 4.1 3.9  CL 106 104  CO2 27 29  GLUCOSE 93 126*  BUN 14 20  CREATININE 0.65 0.86  CALCIUM 8.1* 8.9  MG -- --  PHOS -- --   Micro Results: No results found for this or any previous visit (from the past 240 hour(s)). Studies/Results: Dg Chest 1 View  02/28/2011  *RADIOLOGY REPORT*  Clinical Data: Fall.  Multiple body aches.  CHEST - 1 VIEW  Comparison: 11/23/2010  Findings: The lungs are clear without focal infiltrate, edema, pneumothorax or pleural effusion. The cardiopericardial silhouette is enlarged. Imaged bony structures of the thorax are intact.  IMPRESSION: Stable.  No acute findings.  Original Report Authenticated By: ERIC A. MANSELL, M.D.   Dg Hip Complete Left  02/28/2011  *RADIOLOGY REPORT*  Clinical Data: .  LEFT HIP - COMPLETE 2+ VIEW  Comparison: 01/31/2011  Findings: AP pelvis with frontal and frog-leg lateral  views of the left hip show the patient be status post bilateral hip replacements.  Entire right prosthesis is not included on this left- sided study.  There is no evidence for left-sided periprosthetic femur fracture.  Bones are diffusely demineralized.  No change in the old right inferior pubic ramus fracture.  Sacrum and SI joints are unremarkable.  IMPRESSION: No acute findings.  Original Report Authenticated By: ERIC A. MANSELL, M.D.   Dg Femur Left  02/28/2011  *RADIOLOGY REPORT*  Clinical Data: Left hip pain, fall  LEFT FEMUR - 2 VIEW  Comparison: Left hip radiographs 02/28/2011  Findings: Osseous demineralization. Joint space narrowing left knee. No acute fracture, dislocation, or bone destruction. Soft tissues unremarkable. Stem of left femoral component of left hip prosthesis noted.  IMPRESSION: Osseous demineralization. Degenerative changes left knee. No definite acute bony abnormalities.  Original Report Authenticated By: Lollie Marrow, M.D.   Ct Head Wo Contrast  02/28/2011  *RADIOLOGY REPORT*  Clinical Data:  Fall.  Altered level of consciousness.  CT HEAD WITHOUT CONTRAST CT CERVICAL SPINE WITHOUT CONTRAST  Technique:  Multidetector CT imaging of the head and cervical spine was performed following the standard protocol without intravenous contrast.  Multiplanar CT image reconstructions of the cervical spine were also generated.  Comparison:  09/12/2010  CT HEAD  Findings: There is no evidence for acute hemorrhage, hydrocephalus, mass lesion, or  abnormal extra-axial fluid collection.  No definite CT evidence for acute infarction.  Diffuse loss of parenchymal volume is consistent with atrophy. Patchy low attenuation in the deep hemispheric and periventricular white matter is nonspecific, but likely reflects chronic microvascular ischemic demyelination. The visualized paranasal sinuses and mastoid air cells are clear.  IMPRESSION: No acute intracranial abnormality.  Atrophy with chronic small  vessel white matter ischemic demyelination.  CT CERVICAL SPINE  Findings: Imaging was obtained from the skull base through the T1-2 interspace.  There is no evidence for an acute fracture.  No subluxation.  Loss of disc height is seen at C5-6.  The facets are well-aligned bilaterally.  There is some right-sided facet osteoarthritis at C4-5 and C7-T1.  Normal cervical lordosis is preserved.  There is no prevertebral soft tissue swelling.  IMPRESSION: No acute bony abnormality in the cervical spine.  Original Report Authenticated By: ERIC A. MANSELL, M.D.   Ct Cervical Spine Wo Contrast  02/28/2011  *RADIOLOGY REPORT*  Clinical Data:  Fall.  Altered level of consciousness.  CT HEAD WITHOUT CONTRAST CT CERVICAL SPINE WITHOUT CONTRAST  Technique:  Multidetector CT imaging of the head and cervical spine was performed following the standard protocol without intravenous contrast.  Multiplanar CT image reconstructions of the cervical spine were also generated.  Comparison:  09/12/2010  CT HEAD  Findings: There is no evidence for acute hemorrhage, hydrocephalus, mass lesion, or abnormal extra-axial fluid collection.  No definite CT evidence for acute infarction.  Diffuse loss of parenchymal volume is consistent with atrophy. Patchy low attenuation in the deep hemispheric and periventricular white matter is nonspecific, but likely reflects chronic microvascular ischemic demyelination. The visualized paranasal sinuses and mastoid air cells are clear.  IMPRESSION: No acute intracranial abnormality.  Atrophy with chronic small vessel white matter ischemic demyelination.  CT CERVICAL SPINE  Findings: Imaging was obtained from the skull base through the T1-2 interspace.  There is no evidence for an acute fracture.  No subluxation.  Loss of disc height is seen at C5-6.  The facets are well-aligned bilaterally.  There is some right-sided facet osteoarthritis at C4-5 and C7-T1.  Normal cervical lordosis is preserved.  There is no  prevertebral soft tissue swelling.  IMPRESSION: No acute bony abnormality in the cervical spine.  Original Report Authenticated By: ERIC A. MANSELL, M.D.   Ct Pelvis Wo Contrast  02/28/2011  *RADIOLOGY REPORT*  Clinical Data:  Left hip pain, fall, negative radiographs  CT PELVIS WITHOUT CONTRAST  Technique:  Multidetector CT imaging of the pelvis was performed following the standard protocol without intravenous contrast. Sagittal and coronal MPR images reconstructed from axial data set.  Comparison:  None Correlation:  Left hip radiographs 02/28/2011  Findings: Severe osseous demineralization, limiting exam. Symmetric SI joints. Bilateral hip prostheses with significant beam hardening artifacts. Old right superior and inferior pubic rami fractures with minimal sclerosis. Deformity and sclerosis identified in left sacrum compatible with fracture, suspect subacute to old as well. Subtle irregularity identified at the anterior column of the left acetabulum, questionable nondisplaced fracture. No additional fracture or dislocation seen. Small amount of lucency is identified in the proximal left femur, adjacent to the femoral stem of the left hip prosthesis, not seen on right, cannot exclude loosening. No additional focal osseous abnormalities identified. Atherosclerotic calcifications noted in pelvis. No definite acute intrapelvic process seen within limitations of exam.  IMPRESSION: Bilateral hip prostheses. Limitations of exam secondary to beam hardening artifacts from hip prostheses and severe degree of demineralization. Subacute to  old fractures right pubic rami and suspect left sacrum. Questionable fracture versus artifacts at anterior column left acetabulum.  Original Report Authenticated By: Lollie Marrow, M.D.   Dg Shoulder Left  02/28/2011  *RADIOLOGY REPORT*  Clinical Data: Fall.  Pain.  LEFT SHOULDER - 2+ VIEW  Comparison: None.  Findings: Three-view exam left shoulder shows a fracture of the clavicle  near the junction of the middle and distal thirds.  No evidence for shoulder separation or dislocation.  Cortical irregularity at the rotator cuff insertion is consistent with degenerative change.  IMPRESSION: Left clavicle fracture.  Original Report Authenticated By: ERIC A. MANSELL, M.D.   Medications: I have reviewed the patient's current medications. Scheduled Meds:   . sodium chloride  1,000 mL Intravenous STAT  . citalopram  40 mg Oral Daily  . clonazePAM  0.25 mg Oral Daily  . dextromethorphan-guaiFENesin  1 tablet Oral Q12H  . enoxaparin  30 mg Subcutaneous Q24H  . fentaNYL  50 mcg Intravenous Once  . fentaNYL  50 mcg Intravenous Once  . furosemide  20 mg Oral Daily  . HYDROmorphone      . memantine  5 mg Oral BID  . pantoprazole  40 mg Oral Q1200  . polyethylene glycol  17 g Oral Daily  . rivastigmine  1 patch Transdermal Daily  . rosuvastatin  10 mg Oral Daily   Continuous Infusions:   . 0.9 % NaCl with KCl 20 mEq / L 75 mL/hr at 03/01/11 0858   PRN Meds:.acetaminophen, acetaminophen, alum & mag hydroxide-simeth, HYDROcodone-acetaminophen, HYDROmorphone, meclizine, promethazine, senna-docusate Assessment/Plan: Principal Problem:  *Physical deconditioning Active Problems:  DEMENTIA  GERD  PERSONAL HX COLON CANCER  Clavicular fracture: Continue left arm brace.  Hyperlipidemia  Hypertension  Depression  Anxiety  Osteoporosis Possible left anterior acetabular fracture: Orthopedic surgery is not available today. Dr. Hilda Lias is reportedly on call tomorrow and I will consult him in the morning. Patient will remain on bedrest until evaluated by orthopedic surgery. DVT prophylaxis. Supportive care. Bowel regimen.  I discussed the plan with the patient and her son, Jeymi Hepp. Telephone [phone].   LOS: 1 day   Devan Babino L 03/01/2011, 10:14 AM

## 2011-03-01 NOTE — Progress Notes (Signed)
Dr Lendell Caprice notified of pt's son  stated that he wanted Dr. Romeo Apple to see the patient vs Dr. Hilda Lias.  She called back and spoke with the son.  After the call he voiced no further complaint or concerns at this time.

## 2011-03-01 NOTE — Consult Note (Signed)
Reason for Consult:Fracture of left clavicle and left acetabulum Referring Physician: Hospitalist  Kathy Avery is an 77 y.o. female.  HPI: She fell yesterday at [facility] rest home where she is a resident.  She has pain of the left shoulder and left hip.  She has bilateral total hip prosthesis.  She was seen in the ER. Xrays show nondisplaced fracture of the left mid clavicle and also of the left anterior acetabulum seen best on CT exam.  I have reviewed the xrays. She had no head injury.  She has dementia.  She is pleasant to talk with but has very short memory retention.  Past Medical History  Diagnosis Date  . Hypertension   . Depression   . Anxiety   . Alzheimer disease   . GERD (gastroesophageal reflux disease)   . Hyperlipidemia   . Pelvic fracture     Past Surgical History  Procedure Date  . Fracture surgery     pelvis    History reviewed. No pertinent family history.  Social History:  reports that she has never smoked. She has never used smokeless tobacco. She reports that she does not drink alcohol or use illicit drugs.  Allergies:  Allergies  Allergen Reactions  . Morphine And Related     Medications: I have reviewed the patient's current medications.  Results for orders placed during the hospital encounter of 02/28/11 (from the past 48 hour(s))  CBC     Status: Normal   Collection Time   02/28/11  2:20 PM      Component Value Range Comment   WBC 6.2  4.0 - 10.5 (K/uL)    RBC 4.18  3.87 - 5.11 (MIL/uL)    Hemoglobin 13.0  12.0 - 15.0 (g/dL)    HCT 16.1  09.6 - 04.5 (%)    MCV 96.4  78.0 - 100.0 (fL)    MCH 31.1  26.0 - 34.0 (pg)    MCHC 32.3  30.0 - 36.0 (g/dL)    RDW 40.9  81.1 - 91.4 (%)    Platelets 152  150 - 400 (K/uL)   DIFFERENTIAL     Status: Normal   Collection Time   02/28/11  2:20 PM      Component Value Range Comment   Neutrophils Relative 71  43 - 77 (%)    Neutro Abs 4.5  1.7 - 7.7 (K/uL)    Lymphocytes Relative 17  12 - 46 (%)    Lymphs Abs 1.1  0.7 - 4.0 (K/uL)    Monocytes Relative 11  3 - 12 (%)    Monocytes Absolute 0.7  0.1 - 1.0 (K/uL)    Eosinophils Relative 1  0 - 5 (%)    Eosinophils Absolute 0.0  0.0 - 0.7 (K/uL)    Basophils Relative 0  0 - 1 (%)    Basophils Absolute 0.0  0.0 - 0.1 (K/uL)   BASIC METABOLIC PANEL     Status: Abnormal   Collection Time   02/28/11  3:49 PM      Component Value Range Comment   Sodium 140  135 - 145 (mEq/L)    Potassium 3.9  3.5 - 5.1 (mEq/L)    Chloride 104  96 - 112 (mEq/L)    CO2 29  19 - 32 (mEq/L)    Glucose, Bld 126 (*) 70 - 99 (mg/dL)    BUN 20  6 - 23 (mg/dL)    Creatinine, Ser 7.82  0.50 - 1.10 (mg/dL)  Calcium 8.9  8.4 - 10.5 (mg/dL)    GFR calc non Af Amer 60 (*) >90 (mL/min)    GFR calc Af Amer 69 (*) >90 (mL/min)   PROTIME-INR     Status: Normal   Collection Time   02/28/11  3:49 PM      Component Value Range Comment   Prothrombin Time 13.3  11.6 - 15.2 (seconds)    INR 0.99  0.00 - 1.49    URINALYSIS, ROUTINE W REFLEX MICROSCOPIC     Status: Abnormal   Collection Time   02/28/11  6:00 PM      Component Value Range Comment   Color, Urine YELLOW  YELLOW     Appearance HAZY (*) CLEAR     Specific Gravity, Urine 1.015  1.005 - 1.030     pH 7.0  5.0 - 8.0     Glucose, UA NEGATIVE  NEGATIVE (mg/dL)    Hgb urine dipstick NEGATIVE  NEGATIVE     Bilirubin Urine NEGATIVE  NEGATIVE     Ketones, ur NEGATIVE  NEGATIVE (mg/dL)    Protein, ur NEGATIVE  NEGATIVE (mg/dL)    Urobilinogen, UA 0.2  0.0 - 1.0 (mg/dL)    Nitrite NEGATIVE  NEGATIVE     Leukocytes, UA NEGATIVE  NEGATIVE  MICROSCOPIC NOT DONE ON URINES WITH NEGATIVE PROTEIN, BLOOD, LEUKOCYTES, NITRITE, OR GLUCOSE <1000 mg/dL.  BASIC METABOLIC PANEL     Status: Abnormal   Collection Time   03/01/11  6:15 AM      Component Value Range Comment   Sodium 139  135 - 145 (mEq/L)    Potassium 4.1  3.5 - 5.1 (mEq/L)    Chloride 106  96 - 112 (mEq/L)    CO2 27  19 - 32 (mEq/L)    Glucose, Bld 93  70 -  99 (mg/dL)    BUN 14  6 - 23 (mg/dL)    Creatinine, Ser 1.61  0.50 - 1.10 (mg/dL)    Calcium 8.1 (*) 8.4 - 10.5 (mg/dL)    GFR calc non Af Amer 79 (*) >90 (mL/min)    GFR calc Af Amer >90  >90 (mL/min)   CBC     Status: Abnormal   Collection Time   03/01/11  6:15 AM      Component Value Range Comment   WBC 6.7  4.0 - 10.5 (K/uL)    RBC 3.75 (*) 3.87 - 5.11 (MIL/uL)    Hemoglobin 11.7 (*) 12.0 - 15.0 (g/dL)    HCT 09.6  04.5 - 40.9 (%)    MCV 96.3  78.0 - 100.0 (fL)    MCH 31.2  26.0 - 34.0 (pg)    MCHC 32.4  30.0 - 36.0 (g/dL)    RDW 81.1  91.4 - 78.2 (%)    Platelets 142 (*) 150 - 400 (K/uL)     Dg Chest 1 View  02/28/2011  *RADIOLOGY REPORT*  Clinical Data: Fall.  Multiple body aches.  CHEST - 1 VIEW  Comparison: 11/23/2010  Findings: The lungs are clear without focal infiltrate, edema, pneumothorax or pleural effusion. The cardiopericardial silhouette is enlarged. Imaged bony structures of the thorax are intact.  IMPRESSION: Stable.  No acute findings.  Original Report Authenticated By: ERIC A. MANSELL, M.D.   Dg Hip Complete Left  02/28/2011  *RADIOLOGY REPORT*  Clinical Data: .  LEFT HIP - COMPLETE 2+ VIEW  Comparison: 01/31/2011  Findings: AP pelvis with frontal and frog-leg lateral views of the left hip show  the patient be status post bilateral hip replacements.  Entire right prosthesis is not included on this left- sided study.  There is no evidence for left-sided periprosthetic femur fracture.  Bones are diffusely demineralized.  No change in the old right inferior pubic ramus fracture.  Sacrum and SI joints are unremarkable.  IMPRESSION: No acute findings.  Original Report Authenticated By: ERIC A. MANSELL, M.D.   Dg Femur Left  02/28/2011  *RADIOLOGY REPORT*  Clinical Data: Left hip pain, fall  LEFT FEMUR - 2 VIEW  Comparison: Left hip radiographs 02/28/2011  Findings: Osseous demineralization. Joint space narrowing left knee. No acute fracture, dislocation, or bone  destruction. Soft tissues unremarkable. Stem of left femoral component of left hip prosthesis noted.  IMPRESSION: Osseous demineralization. Degenerative changes left knee. No definite acute bony abnormalities.  Original Report Authenticated By: Lollie Marrow, M.D.   Ct Head Wo Contrast  02/28/2011  *RADIOLOGY REPORT*  Clinical Data:  Fall.  Altered level of consciousness.  CT HEAD WITHOUT CONTRAST CT CERVICAL SPINE WITHOUT CONTRAST  Technique:  Multidetector CT imaging of the head and cervical spine was performed following the standard protocol without intravenous contrast.  Multiplanar CT image reconstructions of the cervical spine were also generated.  Comparison:  09/12/2010  CT HEAD  Findings: There is no evidence for acute hemorrhage, hydrocephalus, mass lesion, or abnormal extra-axial fluid collection.  No definite CT evidence for acute infarction.  Diffuse loss of parenchymal volume is consistent with atrophy. Patchy low attenuation in the deep hemispheric and periventricular white matter is nonspecific, but likely reflects chronic microvascular ischemic demyelination. The visualized paranasal sinuses and mastoid air cells are clear.  IMPRESSION: No acute intracranial abnormality.  Atrophy with chronic small vessel white matter ischemic demyelination.  CT CERVICAL SPINE  Findings: Imaging was obtained from the skull base through the T1-2 interspace.  There is no evidence for an acute fracture.  No subluxation.  Loss of disc height is seen at C5-6.  The facets are well-aligned bilaterally.  There is some right-sided facet osteoarthritis at C4-5 and C7-T1.  Normal cervical lordosis is preserved.  There is no prevertebral soft tissue swelling.  IMPRESSION: No acute bony abnormality in the cervical spine.  Original Report Authenticated By: ERIC A. MANSELL, M.D.   Ct Cervical Spine Wo Contrast  02/28/2011  *RADIOLOGY REPORT*  Clinical Data:  Fall.  Altered level of consciousness.  CT HEAD WITHOUT CONTRAST  CT CERVICAL SPINE WITHOUT CONTRAST  Technique:  Multidetector CT imaging of the head and cervical spine was performed following the standard protocol without intravenous contrast.  Multiplanar CT image reconstructions of the cervical spine were also generated.  Comparison:  09/12/2010  CT HEAD  Findings: There is no evidence for acute hemorrhage, hydrocephalus, mass lesion, or abnormal extra-axial fluid collection.  No definite CT evidence for acute infarction.  Diffuse loss of parenchymal volume is consistent with atrophy. Patchy low attenuation in the deep hemispheric and periventricular white matter is nonspecific, but likely reflects chronic microvascular ischemic demyelination. The visualized paranasal sinuses and mastoid air cells are clear.  IMPRESSION: No acute intracranial abnormality.  Atrophy with chronic small vessel white matter ischemic demyelination.  CT CERVICAL SPINE  Findings: Imaging was obtained from the skull base through the T1-2 interspace.  There is no evidence for an acute fracture.  No subluxation.  Loss of disc height is seen at C5-6.  The facets are well-aligned bilaterally.  There is some right-sided facet osteoarthritis at C4-5 and C7-T1.  Normal cervical  lordosis is preserved.  There is no prevertebral soft tissue swelling.  IMPRESSION: No acute bony abnormality in the cervical spine.  Original Report Authenticated By: ERIC A. MANSELL, M.D.   Ct Pelvis Wo Contrast  02/28/2011  *RADIOLOGY REPORT*  Clinical Data:  Left hip pain, fall, negative radiographs  CT PELVIS WITHOUT CONTRAST  Technique:  Multidetector CT imaging of the pelvis was performed following the standard protocol without intravenous contrast. Sagittal and coronal MPR images reconstructed from axial data set.  Comparison:  None Correlation:  Left hip radiographs 02/28/2011  Findings: Severe osseous demineralization, limiting exam. Symmetric SI joints. Bilateral hip prostheses with significant beam hardening artifacts.  Old right superior and inferior pubic rami fractures with minimal sclerosis. Deformity and sclerosis identified in left sacrum compatible with fracture, suspect subacute to old as well. Subtle irregularity identified at the anterior column of the left acetabulum, questionable nondisplaced fracture. No additional fracture or dislocation seen. Small amount of lucency is identified in the proximal left femur, adjacent to the femoral stem of the left hip prosthesis, not seen on right, cannot exclude loosening. No additional focal osseous abnormalities identified. Atherosclerotic calcifications noted in pelvis. No definite acute intrapelvic process seen within limitations of exam.  IMPRESSION: Bilateral hip prostheses. Limitations of exam secondary to beam hardening artifacts from hip prostheses and severe degree of demineralization. Subacute to old fractures right pubic rami and suspect left sacrum. Questionable fracture versus artifacts at anterior column left acetabulum.  Original Report Authenticated By: Lollie Marrow, M.D.   Dg Shoulder Left  02/28/2011  *RADIOLOGY REPORT*  Clinical Data: Fall.  Pain.  LEFT SHOULDER - 2+ VIEW  Comparison: None.  Findings: Three-view exam left shoulder shows a fracture of the clavicle near the junction of the middle and distal thirds.  No evidence for shoulder separation or dislocation.  Cortical irregularity at the rotator cuff insertion is consistent with degenerative change.  IMPRESSION: Left clavicle fracture.  Original Report Authenticated By: ERIC A. MANSELL, M.D.    Review of Systems  Constitutional: Negative.   HENT: Negative.   Eyes: Negative.   Respiratory: Negative.   Cardiovascular: Negative.   Gastrointestinal: Positive for heartburn.  Genitourinary: Negative.   Musculoskeletal: Positive for joint pain (She is post bilateral total hips.  She has pain of the left hip and left shoudler.) and falls.  Skin: Negative.   Endo/Heme/Allergies: Negative.     Psychiatric/Behavioral: Positive for depression.  She has short term and long term memory loss.  She kept asking why I was visiting her at her home and would re-ask my name several time.  She is very pleasant. Blood pressure 136/80, pulse 82, temperature 98.2 F (36.8 C), temperature source Oral, resp. rate 18, height 5\' 7"  (1.702 m), weight 68.04 kg (150 lb), SpO2 95.00%. Physical Exam  Constitutional: She appears well-developed and well-nourished.  HENT:  Head: Normocephalic and atraumatic.  Eyes: Conjunctivae and EOM are normal. Pupils are equal, round, and reactive to light.  Neck: Normal range of motion. Neck supple.  Cardiovascular: Normal rate, regular rhythm and normal heart sounds.   Respiratory: Effort normal and breath sounds normal.  GI: Soft.  Musculoskeletal: She exhibits tenderness (left shoulder (in a sling) and left hip to motion).       Left shoulder: She exhibits decreased range of motion, tenderness, bony tenderness, swelling, crepitus and pain.       Left hip: She exhibits decreased range of motion and tenderness.       Arms:  Legs: Neurological: She is alert. She has normal reflexes.  Skin: Skin is warm and dry.  Psychiatric: She has a normal mood and affect. Her behavior is normal.    Assessment/Plan: 1.  Fracture of the left mid clavicle,non displaced. I would continue the sling for the next four to six weeks.  Pain medicine as needed.  2.  Fracture of the left anterior acetabulum with total hip prosthesis in place and no signs of loosening of the prosthesis. She will need bed rest and chair to bed help with transfers for the next six weeks or so.  She will be limited from any weight bearing during that time. She will need to be covered with anti-coagulation. Physical therapy can be done once the bone shows signs of healing near the end of that time.  Need to avoid any extension of the fracture that could loosen the acetabular cup.  Consideration needs to  be given to transfer to a skilled unit until the acetabulum heals unless accommodations can be made at the rest home for her.   Margrette Wynia 03/01/2011, 12:17 PM
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
## 2011-03-02 DIAGNOSIS — S32409A Unspecified fracture of unspecified acetabulum, initial encounter for closed fracture: Secondary | ICD-10-CM | POA: Diagnosis present

## 2011-03-02 MED ORDER — HYDROCODONE-ACETAMINOPHEN 5-325 MG PO TABS: 1.0000 | ORAL_TABLET | Freq: Three times a day (TID) | ORAL | Status: DC

## 2011-03-02 NOTE — Progress Notes (Signed)
Pt returning to [facility]. Ordered  w/c and left platform walker from [employer] to be delivered today

## 2011-03-02 NOTE — Progress Notes (Signed)
Physical Therapy Evaluation Patient Details Name: Kathy Avery MRN: [id number] DOB: [birth year] Today's Date: 03/02/2011  Problem List:  Patient Active Problem List  Diagnoses  . DEMENTIA  . GERD  . PERSONAL HX COLON CANCER  . Clavicular fracture  . Physical deconditioning  . Hyperlipidemia  . Hypertension  . Depression  . Anxiety  . Osteoporosis    Past Medical History:  Past Medical History  Diagnosis Date  . Hypertension   . Depression   . Anxiety   . Alzheimer disease   . GERD (gastroesophageal reflux disease)   . Hyperlipidemia   . Pelvic fracture    Past Surgical History:  Past Surgical History  Procedure Date  . Fracture surgery     pelvis    PT Assessment/Plan/Recommendation PT Assessment Clinical Impression Statement: pleasant, cooperative pt with no short term memory presents with multiple fx impeding mobility. Max assist needed for bed to chair transfer, NWB L, but tolerated well. Recommens dailySNF at d/c to develop transfer ability and progress to ROM/strengthening ex when ordered by MD PT Recommendation/Assessment: Patient will need skilled PT in the acute care venue PT Problem List: Decreased strength;Decreased range of motion;Decreased activity tolerance;Decreased mobility;Decreased cognition;Decreased safety awareness;Decreased knowledge of precautions;Pain Barriers to Discharge: None PT Therapy Diagnosis : Acute pain;Generalized weakness (unable to transfer without assist, unable to ambulate) PT Plan PT Frequency: Min 5X/week PT Treatment/Interventions: Functional mobility training;Therapeutic exercise;Patient/family education PT Recommendation Recommendations for Other Services: OT consult Follow Up Recommendations: Skilled nursing facility;Home health PT (pt would be appropriate to return to [facility] if they can manage) Equipment Recommended: Defer to next venue PT Goals  Acute Rehab PT Goals PT Goal Formulation: Patient unable to participate in  goal setting Time For Goal Achievement: 2 weeks Pt will go Sit to Supine/Side: with mod assist Pt will Transfer Sit to Stand/Stand to Sit: with mod assist Pt will Transfer Bed to Chair/Chair to Bed: with max assist  PT Evaluation Precautions/Restrictions  Precautions Precautions:  (avoid extension of L hip) Required Braces or Orthoses: No Restrictions Weight Bearing Restrictions: Yes LLE Weight Bearing: Non weight bearing Prior Functioning  Home Living Type of Home: Assisted living Receives Help From: Personal care attendant Home Layout: One level Home Access: Level entry Prior Function Level of Independence: Needs assistance with ADLs (prior functional status unknown-did ambulate with cane/walke) Driving: No Cognition Cognition Arousal/Alertness: Awake/alert Overall Cognitive Status: History of cognitive impairments History of Cognitive Impairment: Appears at baseline functioning Memory: Appears impaired Memory Deficits: no short term memory Orientation Level: Oriented to person;Disoriented to place;Disoriented to situation;Disoriented to time Sensation/Coordination Sensation Light Touch: Appears Intact Extremity Assessment RUE Assessment RUE Assessment: Within Functional Limits LUE Assessment LUE Assessment: Not tested (in sling) RLE Assessment RLE Assessment: Within Functional Limits LLE Assessment LLE Assessment: Exceptions to [facility] LLE PROM (degrees) Left Hip Flexion 0-125: 50 Left Hip ABduction 0-45: 30 Mobility (including Balance) Bed Mobility Bed Mobility: Yes Supine to Sit: 1: +1 Total assist;HOB elevated (Comment degrees) (60 deg) Sitting - Scoot to Edge of Bed: 1: +1 Total assist Sitting - Scoot to Edge of Bed Details (indicate cue type and reason): pt unable to assist but did not resist Transfers Transfers: Yes Sit to Stand: 2: Max assist;From bed Sit to Stand Details (indicate cue type and reason): LUE in sling-only able to assist with RUE Stand to  Sit: 3: Mod assist Stand Pivot Transfers: 2: Max Actuary Details (indicate cue type and reason): has difficulty maintaining true L NWB status  Ambulation/Gait Ambulation/Gait: No (not possible at this time due to multiple fx) Stairs: No Wheelchair Mobility Wheelchair Mobility: No  Posture/Postural Control Posture/Postural Control: No significant limitations Balance Balance Assessed: No Exercise  Total Joint Exercises Ankle Circles/Pumps: AROM;Both;10 reps;Supine Heel Slides: AAROM;Both;5 reps;Supine Hip ABduction/ADduction: AAROM;Both;5 reps;Supine General Exercises - Lower Extremity Ankle Circles/Pumps: AROM;Both;10 reps;Supine Heel Slides: AAROM;Both;5 reps;Supine Hip ABduction/ADduction: AAROM;Both;5 reps;Supine Low Level/ICU Exercises Ankle Circles/Pumps: AROM;Both;10 reps;Supine Hip ABduction/ADduction: AAROM;Both;5 reps;Supine Heel Slides: AAROM;Both;5 reps;Supine Amputee Exercises Hip ABduction/ADduction: AAROM;Both;5 reps;Supine Antenatal Exercises Ankle Circles/Pumps: AROM;Both;10 reps;Supine Hip ABduction/ADduction: AAROM;Both;5 reps;Supine End of Session PT - End of Session Equipment Utilized During Treatment: Gait belt Activity Tolerance: Patient tolerated treatment well Patient left: in chair;with call bell in reach (no chair alarm available) Nurse Communication: Mobility status for transfers;Weight bearing status General Behavior During Session: [facility] for tasks performed Cognition: Impaired, at baseline  Konrad Penta 03/02/2011, 9:49 AM

## 2011-03-02 NOTE — Consult Note (Addendum)
Pt d/c today by MD back to [facility].  Facility assessed pt today after PT evaluation and they felt pt could return.  CSW left message with pt's son regarding d/c.  Pt to transfer via facility van.  Fredonia Highland, RN reviewed FL2 for accuracy. Facility requests platform walker and wheelchair.  CM arranged through [employer].   Karn Cassis

## 2011-03-02 NOTE — Progress Notes (Signed)
UR chart review completed.  

## 2011-03-02 NOTE — Discharge Summary (Signed)
Physician Discharge Summary  Patient ID: Kathy Avery MRN: [id number] DOB/AGE: 75-Jan-1925 75 y.o.  Admit date: 02/28/2011 Discharge date: 03/02/2011  Discharge Diagnoses:  Principal Problem:  *Physical deconditioning Active Problems:  Acetabular fracture  DEMENTIA  GERD  Clavicular fracture  Hyperlipidemia  Hypertension  Depression  Anxiety  Osteoporosis   Current Discharge Medication List    START taking these medications   Details  HYDROcodone-acetaminophen (NORCO) 5-325 MG per tablet Take 1 tablet by mouth 3 (three) times daily. Qty: 30 tablet, Refills: 0      CONTINUE these medications which have NOT CHANGED   Details  acetaminophen (TYLENOL) 500 MG tablet Take 1,000 mg by mouth 2 (two) times daily.     Calcium Carbonate-Vitamin D (CALCIUM 600/VITAMIN D PO) Take 1 tablet by mouth daily.      Cholecalciferol (VITAMIN D) 1000 UNITS capsule Take 1,000 Units by mouth daily.      citalopram (CELEXA) 40 MG tablet Take 40 mg by mouth daily.     clonazePAM (KLONOPIN) 0.5 MG tablet Take 0.25 mg by mouth. Patient takes (0.25mg ) at 2pm and Patient takes (0.5mg ) at bedtime    memantine (NAMENDA) 5 MG tablet Take 5 mg by mouth 2 (two) times daily.      omeprazole (PRILOSEC) 20 MG capsule Take 20 mg by mouth daily.      polyethylene glycol (MIRALAX / GLYCOLAX) packet Take 17 g by mouth daily.      promethazine (PHENERGAN) 12.5 MG tablet Take 12.5 mg by mouth every 6 (six) hours as needed. For nausea    ranitidine (ZANTAC) 150 MG capsule Take 150 mg by mouth every evening.      rivastigmine (EXELON) 9.5 mg/24hr Place 1 patch onto the skin daily.      rosuvastatin (CRESTOR) 10 MG tablet Take 10 mg by mouth daily.      vitamin C (ASCORBIC ACID) 500 MG tablet Take 1,000 mg by mouth daily.     dextromethorphan-guaiFENesin (MUCINEX DM) 30-600 MG per 12 hr tablet Take 1 tablet by mouth every 12 (twelve) hours.      furosemide (LASIX) 20 MG tablet Take 20 mg by mouth daily.       meclizine (ANTIVERT) 25 MG tablet Take 25 mg by mouth every 6 (six) hours as needed. for dizziness.    naproxen (NAPROSYN) 250 MG tablet Take 250 mg by mouth 2 (two) times daily as needed. For pain    UNABLE TO FIND Take by mouth 1 day or 1 dose. Med Name: Calcium 600 mg + D once daily         Discharge Orders    Future Orders Please Complete By Expires   Diet general      Discharge instructions      Comments:   Use platform walker with assistance. Nonweightbearing to the left leg. Keep left arm in sling.   Walker       Scheduling Instructions:   Nonweightbearing to left leg.   Walk with assistance      Care order/instruction      Scheduling Instructions:   Schedule followup with Dr. Hilda Lias in 4 weeks. Followup left acetabular fracture and left clavicular fracture.      Follow-up Information    Follow up with KEELING,WAYNE in 4 weeks.   Contact information:   [address] [place] [address] [phone]          Disposition: Assisted-living facility  Discharged Condition: Stable  Consults: Treatment Team:  Deniece Portela  Keeling  Labs:   Results for orders placed during the hospital encounter of 02/28/11 (from the past 48 hour(s))  BASIC METABOLIC PANEL     Status: Abnormal   Collection Time   02/28/11  3:49 PM      Component Value Range Comment   Sodium 140  135 - 145 (mEq/L)    Potassium 3.9  3.5 - 5.1 (mEq/L)    Chloride 104  96 - 112 (mEq/L)    CO2 29  19 - 32 (mEq/L)    Glucose, Bld 126 (*) 70 - 99 (mg/dL)    BUN 20  6 - 23 (mg/dL)    Creatinine, Ser 0.86  0.50 - 1.10 (mg/dL)    Calcium 8.9  8.4 - 10.5 (mg/dL)    GFR calc non Af Amer 60 (*) >90 (mL/min)    GFR calc Af Amer 69 (*) >90 (mL/min)   PROTIME-INR     Status: Normal   Collection Time   02/28/11  3:49 PM      Component Value Range Comment   Prothrombin Time 13.3  11.6 - 15.2 (seconds)    INR 0.99  0.00 - 1.49    URINALYSIS, ROUTINE W REFLEX MICROSCOPIC     Status:  Abnormal   Collection Time   02/28/11  6:00 PM      Component Value Range Comment   Color, Urine YELLOW  YELLOW     Appearance HAZY (*) CLEAR     Specific Gravity, Urine 1.015  1.005 - 1.030     pH 7.0  5.0 - 8.0     Glucose, UA NEGATIVE  NEGATIVE (mg/dL)    Hgb urine dipstick NEGATIVE  NEGATIVE     Bilirubin Urine NEGATIVE  NEGATIVE     Ketones, ur NEGATIVE  NEGATIVE (mg/dL)    Protein, ur NEGATIVE  NEGATIVE (mg/dL)    Urobilinogen, UA 0.2  0.0 - 1.0 (mg/dL)    Nitrite NEGATIVE  NEGATIVE     Leukocytes, UA NEGATIVE  NEGATIVE  MICROSCOPIC NOT DONE ON URINES WITH NEGATIVE PROTEIN, BLOOD, LEUKOCYTES, NITRITE, OR GLUCOSE <1000 mg/dL.  BASIC METABOLIC PANEL     Status: Abnormal   Collection Time   03/01/11  6:15 AM      Component Value Range Comment   Sodium 139  135 - 145 (mEq/L)    Potassium 4.1  3.5 - 5.1 (mEq/L)    Chloride 106  96 - 112 (mEq/L)    CO2 27  19 - 32 (mEq/L)    Glucose, Bld 93  70 - 99 (mg/dL)    BUN 14  6 - 23 (mg/dL)    Creatinine, Ser 5.78  0.50 - 1.10 (mg/dL)    Calcium 8.1 (*) 8.4 - 10.5 (mg/dL)    GFR calc non Af Amer 79 (*) >90 (mL/min)    GFR calc Af Amer >90  >90 (mL/min)   CBC     Status: Abnormal   Collection Time   03/01/11  6:15 AM      Component Value Range Comment   WBC 6.7  4.0 - 10.5 (K/uL)    RBC 3.75 (*) 3.87 - 5.11 (MIL/uL)    Hemoglobin 11.7 (*) 12.0 - 15.0 (g/dL)    HCT 46.9  62.9 - 52.8 (%)    MCV 96.3  78.0 - 100.0 (fL)    MCH 31.2  26.0 - 34.0 (pg)    MCHC 32.4  30.0 - 36.0 (g/dL)    RDW 41.3  24.4 - 01.0 (%)  Platelets 142 (*) 150 - 400 (K/uL)   MRSA PCR SCREENING     Status: Normal   Collection Time   03/01/11  7:03 PM      Component Value Range Comment   MRSA by PCR NEGATIVE  NEGATIVE      Diagnostics:  Dg Chest 1 View  02/28/2011  *RADIOLOGY REPORT*  Clinical Data: Fall.  Multiple body aches.  CHEST - 1 VIEW  Comparison: 11/23/2010  Findings: The lungs are clear without focal infiltrate, edema, pneumothorax or pleural  effusion. The cardiopericardial silhouette is enlarged. Imaged bony structures of the thorax are intact.  IMPRESSION: Stable.  No acute findings.  Original Report Authenticated By: ERIC A. MANSELL, M.D.   Dg Pelvis 1-2 Views  01/31/2011  *RADIOLOGY REPORT*  Clinical Data: Hip pain post fall  PELVIS - 1-2 VIEW  Comparison: 12/12/2010  Findings: Bilateral hip prostheses. Osseous demineralization. No acute fracture, dislocation or bone destruction. Scattered pelvic phleboliths. No periprosthetic lucency. Tip of femoral component not imaged. Sclerosis identified at an old inferior right pubic ramus fracture.  IMPRESSION: Bilateral hip prostheses. Osseous demineralization. Old right inferior pubic ramus fracture. No acute abnormalities.  Original Report Authenticated By: Lollie Marrow, M.D.   Dg Hip Complete Left  02/28/2011  *RADIOLOGY REPORT*  Clinical Data: .  LEFT HIP - COMPLETE 2+ VIEW  Comparison: 01/31/2011  Findings: AP pelvis with frontal and frog-leg lateral views of the left hip show the patient be status post bilateral hip replacements.  Entire right prosthesis is not included on this left- sided study.  There is no evidence for left-sided periprosthetic femur fracture.  Bones are diffusely demineralized.  No change in the old right inferior pubic ramus fracture.  Sacrum and SI joints are unremarkable.  IMPRESSION: No acute findings.  Original Report Authenticated By: ERIC A. MANSELL, M.D.   Dg Femur Left  02/28/2011  *RADIOLOGY REPORT*  Clinical Data: Left hip pain, fall  LEFT FEMUR - 2 VIEW  Comparison: Left hip radiographs 02/28/2011  Findings: Osseous demineralization. Joint space narrowing left knee. No acute fracture, dislocation, or bone destruction. Soft tissues unremarkable. Stem of left femoral component of left hip prosthesis noted.  IMPRESSION: Osseous demineralization. Degenerative changes left knee. No definite acute bony abnormalities.  Original Report Authenticated By: Lollie Marrow, M.D.   Ct Head Wo Contrast  02/28/2011  *RADIOLOGY REPORT*  Clinical Data:  Fall.  Altered level of consciousness.  CT HEAD WITHOUT CONTRAST CT CERVICAL SPINE WITHOUT CONTRAST  Technique:  Multidetector CT imaging of the head and cervical spine was performed following the standard protocol without intravenous contrast.  Multiplanar CT image reconstructions of the cervical spine were also generated.  Comparison:  09/12/2010  CT HEAD  Findings: There is no evidence for acute hemorrhage, hydrocephalus, mass lesion, or abnormal extra-axial fluid collection.  No definite CT evidence for acute infarction.  Diffuse loss of parenchymal volume is consistent with atrophy. Patchy low attenuation in the deep hemispheric and periventricular white matter is nonspecific, but likely reflects chronic microvascular ischemic demyelination. The visualized paranasal sinuses and mastoid air cells are clear.  IMPRESSION: No acute intracranial abnormality.  Atrophy with chronic small vessel white matter ischemic demyelination.  CT CERVICAL SPINE  Findings: Imaging was obtained from the skull base through the T1-2 interspace.  There is no evidence for an acute fracture.  No subluxation.  Loss of disc height is seen at C5-6.  The facets are well-aligned bilaterally.  There is some right-sided facet osteoarthritis at  C4-5 and C7-T1.  Normal cervical lordosis is preserved.  There is no prevertebral soft tissue swelling.  IMPRESSION: No acute bony abnormality in the cervical spine.  Original Report Authenticated By: ERIC A. MANSELL, M.D.   Ct Cervical Spine Wo Contrast  02/28/2011  *RADIOLOGY REPORT*  Clinical Data:  Fall.  Altered level of consciousness.  CT HEAD WITHOUT CONTRAST CT CERVICAL SPINE WITHOUT CONTRAST  Technique:  Multidetector CT imaging of the head and cervical spine was performed following the standard protocol without intravenous contrast.  Multiplanar CT image reconstructions of the cervical spine were also  generated.  Comparison:  09/12/2010  CT HEAD  Findings: There is no evidence for acute hemorrhage, hydrocephalus, mass lesion, or abnormal extra-axial fluid collection.  No definite CT evidence for acute infarction.  Diffuse loss of parenchymal volume is consistent with atrophy. Patchy low attenuation in the deep hemispheric and periventricular white matter is nonspecific, but likely reflects chronic microvascular ischemic demyelination. The visualized paranasal sinuses and mastoid air cells are clear.  IMPRESSION: No acute intracranial abnormality.  Atrophy with chronic small vessel white matter ischemic demyelination.  CT CERVICAL SPINE  Findings: Imaging was obtained from the skull base through the T1-2 interspace.  There is no evidence for an acute fracture.  No subluxation.  Loss of disc height is seen at C5-6.  The facets are well-aligned bilaterally.  There is some right-sided facet osteoarthritis at C4-5 and C7-T1.  Normal cervical lordosis is preserved.  There is no prevertebral soft tissue swelling.  IMPRESSION: No acute bony abnormality in the cervical spine.  Original Report Authenticated By: ERIC A. MANSELL, M.D.   Ct Pelvis Wo Contrast  02/28/2011  *RADIOLOGY REPORT*  Clinical Data:  Left hip pain, fall, negative radiographs  CT PELVIS WITHOUT CONTRAST  Technique:  Multidetector CT imaging of the pelvis was performed following the standard protocol without intravenous contrast. Sagittal and coronal MPR images reconstructed from axial data set.  Comparison:  None Correlation:  Left hip radiographs 02/28/2011  Findings: Severe osseous demineralization, limiting exam. Symmetric SI joints. Bilateral hip prostheses with significant beam hardening artifacts. Old right superior and inferior pubic rami fractures with minimal sclerosis. Deformity and sclerosis identified in left sacrum compatible with fracture, suspect subacute to old as well. Subtle irregularity identified at the anterior column of the  left acetabulum, questionable nondisplaced fracture. No additional fracture or dislocation seen. Small amount of lucency is identified in the proximal left femur, adjacent to the femoral stem of the left hip prosthesis, not seen on right, cannot exclude loosening. No additional focal osseous abnormalities identified. Atherosclerotic calcifications noted in pelvis. No definite acute intrapelvic process seen within limitations of exam.  IMPRESSION: Bilateral hip prostheses. Limitations of exam secondary to beam hardening artifacts from hip prostheses and severe degree of demineralization. Subacute to old fractures right pubic rami and suspect left sacrum. Questionable fracture versus artifacts at anterior column left acetabulum.  Original Report Authenticated By: Lollie Marrow, M.D.   Dg Shoulder Left  02/28/2011  *RADIOLOGY REPORT*  Clinical Data: Fall.  Pain.  LEFT SHOULDER - 2+ VIEW  Comparison: None.  Findings: Three-view exam left shoulder shows a fracture of the clavicle near the junction of the middle and distal thirds.  No evidence for shoulder separation or dislocation.  Cortical irregularity at the rotator cuff insertion is consistent with degenerative change.  IMPRESSION: Left clavicle fracture.  Original Report Authenticated By: ERIC A. MANSELL, M.D.    Full Code   Hospital Course:  Please see  H&P for complete admission details. Ms. Towe is an 77 year old demented white female from assisted living who fell and sustained a left clavicle fracture. She was placed in a left arm sliding in the emergency room. She was unable to bear weight on her left leg and a CAT scan of the pelvis showed probable left anterior column fracture. She was admitted to the hospitalist service and orthopedics was consulted. She was placed on DVT prophylaxis and pain medications. Nonoperative management was recommended. Dr. Hilda Lias recommended nonweightbearing to the left leg but that she could work with physical therapy  to get out of bed to chair. She has worked with physical therapy. She's been evaluated by the assisted living facility who works up to her back with scheduled pain medications per their request and physical therapy. Total time on the day of discharge is greater than 30 minutes.  Discharge Exam: Blood pressure 174/93, pulse 83, temperature 98.1 F (36.7 C), temperature source Oral, resp. rate 20, height 5\' 7"  (1.702 m), weight 68.04 kg (150 lb), SpO2 95.00%.  Unchanged from 03/01/2011   Signed: Crista Curb L 03/02/2011, 3:19 PM

## 2011-03-02 NOTE — Progress Notes (Signed)
Physical Therapy Treatment Patient Details Name: Kathy Avery MRN: [id number] DOB: Aug 21, 1923 Today's Date: 03/02/2011  PT Assessment/Plan  PT - Assessment/Plan Comments on Treatment Session: pt in much more pain...might need to be on scheduled pain med regime to help keep pain under control...she doesn't  remember to ask for med PT Plan: Discharge plan remains appropriate Equipment Recommended: Wheelchair (measurements);Standard walker (platform for L side..to be used later on) PT Goals  Acute Rehab PT Goals PT Goal Formulation: Patient unable to participate in goal setting Time For Goal Achievement: 2 weeks Pt will go Sit to Supine/Side: with mod assist Pt will Transfer Sit to Stand/Stand to Sit: with mod assist PT Transfer Goal: Sit to Stand/Stand to Sit - Progress: Progressing toward goal Pt will Transfer Bed to Chair/Chair to Bed: with max assist PT Transfer Goal: Bed to Chair/Chair to Bed - Progress: Progressing toward goal  PT Treatment Precautions/Restrictions  Precautions Precautions:  (avoid extension of L hip) Required Braces or Orthoses: No Restrictions Weight Bearing Restrictions: Yes LLE Weight Bearing: Non weight bearing Mobility (including Balance) Bed Mobility Sit to Supine - Right: 1: +2 Total assist Sit to Supine - Right Details (indicate cue type and reason): pt very resistant to having assist, but unable to do without it Transfers Sit to Stand: 1: +2 Total assist;From chair/3-in-1 Sit to Stand Details (indicate cue type and reason): pt c/o severe pain L shoulder with any activity, although shoulder maintained in splint and at rest Stand to Sit: 1: +2 Total assist Stand Pivot Transfers: 1: +2 Total assist Ambulation/Gait Ambulation/Gait: No Stairs: No Wheelchair Mobility Wheelchair Mobility: No    Exercise    End of Session PT - End of Session Equipment Utilized During Treatment: Gait belt Activity Tolerance: Patient limited by pain Patient left:  in bed;with call bell in reach;with bed alarm set;with family/visitor present Nurse Communication: Mobility status for transfers General Behavior During Session: Agitated Cognition: Impaired, at baseline  Myrlene Broker L 03/02/2011, 1:44 PM

## 2011-03-02 NOTE — Progress Notes (Signed)
Report Called to Victorino Sparrow, Nature conservation officer.  She verbalizes understanding and voiced no further questions or understanding at this time.  Told her she could call me for questions.  Pt left the floor with staff and [facility] with packet.  She was premedicated before she left the floor, and was in stable condition when she left.

## 2011-03-07 ENCOUNTER — Emergency Department (HOSPITAL_COMMUNITY): Payer: Medicare Other

## 2011-03-07 ENCOUNTER — Emergency Department (HOSPITAL_COMMUNITY)
Admission: EM | Admit: 2011-03-07 | Discharge: 2011-03-07 | Disposition: A | Discharge status: Nursing Home (Includes ICF) | Payer: Medicare Other | Attending: Emergency Medicine | Admitting: Emergency Medicine

## 2011-03-07 ENCOUNTER — Encounter (HOSPITAL_COMMUNITY): Payer: Self-pay

## 2011-03-07 DIAGNOSIS — M25559 Pain in unspecified hip: Secondary | ICD-10-CM | POA: Insufficient documentation

## 2011-03-07 DIAGNOSIS — F0280 Dementia in other diseases classified elsewhere without behavioral disturbance: Secondary | ICD-10-CM | POA: Insufficient documentation

## 2011-03-07 DIAGNOSIS — F329 Major depressive disorder, single episode, unspecified: Secondary | ICD-10-CM | POA: Insufficient documentation

## 2011-03-07 DIAGNOSIS — K219 Gastro-esophageal reflux disease without esophagitis: Secondary | ICD-10-CM | POA: Insufficient documentation

## 2011-03-07 DIAGNOSIS — Z85038 Personal history of other malignant neoplasm of large intestine: Secondary | ICD-10-CM | POA: Insufficient documentation

## 2011-03-07 DIAGNOSIS — Y9210 Unspecified residential institution as the place of occurrence of the external cause: Secondary | ICD-10-CM | POA: Insufficient documentation

## 2011-03-07 DIAGNOSIS — F411 Generalized anxiety disorder: Secondary | ICD-10-CM | POA: Insufficient documentation

## 2011-03-07 DIAGNOSIS — W06XXXA Fall from bed, initial encounter: Secondary | ICD-10-CM | POA: Insufficient documentation

## 2011-03-07 DIAGNOSIS — I10 Essential (primary) hypertension: Secondary | ICD-10-CM | POA: Insufficient documentation

## 2011-03-07 DIAGNOSIS — G309 Alzheimer's disease, unspecified: Secondary | ICD-10-CM | POA: Insufficient documentation

## 2011-03-07 DIAGNOSIS — F3289 Other specified depressive episodes: Secondary | ICD-10-CM | POA: Insufficient documentation

## 2011-03-07 DIAGNOSIS — S7002XA Contusion of left hip, initial encounter: Secondary | ICD-10-CM

## 2011-03-07 DIAGNOSIS — F0390 Unspecified dementia without behavioral disturbance: Secondary | ICD-10-CM | POA: Insufficient documentation

## 2011-03-07 DIAGNOSIS — W19XXXA Unspecified fall, initial encounter: Secondary | ICD-10-CM

## 2011-03-07 DIAGNOSIS — E785 Hyperlipidemia, unspecified: Secondary | ICD-10-CM | POA: Insufficient documentation

## 2011-03-07 DIAGNOSIS — S7000XA Contusion of unspecified hip, initial encounter: Secondary | ICD-10-CM | POA: Insufficient documentation

## 2011-03-07 HISTORY — DX: Malignant (primary) neoplasm, unspecified: C80.1

## 2011-03-07 HISTORY — DX: Fracture of unspecified part of unspecified clavicle, initial encounter for closed fracture: S42.009A

## 2011-03-07 NOTE — ED Notes (Signed)
EMS reports pt has fractured left hip.  Son says was told pt may possibly have hairline fracture of pelvis.  Is supposed to see an orthopedist in the near future.

## 2011-03-07 NOTE — ED Notes (Signed)
Taken to x-ray via stretcher--Stable. 

## 2011-03-07 NOTE — ED Notes (Signed)
ems reports pt is a resident of [address] and staff found pt on the floor beside her bed.  Pt alert, oriented to self and place but doesn't know how she fell.  EMS reports pt has fractured left clavicle and left hip.  Pt c;/o pain to left clavicle area, left hip, and lower back.  Denies any head pain.  Pt's face appears swollen under R eye but pt says is not new and says is not sore to touch.

## 2011-03-07 NOTE — ED Notes (Signed)
Pt's son at bedside and states swelling under r eye is new.

## 2011-03-07 NOTE — ED Notes (Signed)
Returned to [facility] on [facility], general practice and talking to son at bedside.

## 2011-03-07 NOTE — ED Provider Notes (Signed)
History     CSN: [id number] Arrival date & time: 03/07/2011  5:02 PM   First MD Initiated Contact with Patient 03/07/11 1711      Chief Complaint  Patient presents with  . Fall    (Consider location/radiation/quality/duration/timing/severity/associated sxs/prior treatment) HPI..... accidental fall out of bed. Complains of left lateral hip pain. Level V caveat for dementia. Recent fall resulting in left clavicle fracture. Accident happened a short time ago. No other complaints  Past Medical History  Diagnosis Date  . Hypertension   . Depression   . Anxiety   . Alzheimer disease   . GERD (gastroesophageal reflux disease)   . Hyperlipidemia   . Pelvic fracture   . Clavicular fracture   . Cancer     colon    Past Surgical History  Procedure Date  . Fracture surgery     pelvis    No family history on file.  History  Substance Use Topics  . Smoking status: Never Smoker   . Smokeless tobacco: Never Used  . Alcohol Use: No    OB History    Grav Para Term Preterm Abortions TAB SAB Ect Mult Living                  Review of Systems  Unable to perform ROS: Dementia    Allergies  Morphine and related  Home Medications   Current Outpatient Rx  Name Route Sig Dispense Refill  . ACETAMINOPHEN 500 MG PO TABS Oral Take 1,000 mg by mouth 2 (two) times daily.     Marland Kitchen CALCIUM 600/VITAMIN D PO Oral Take 1 tablet by mouth daily.      Marland Kitchen VITAMIN D 1000 UNITS PO CAPS Oral Take 1,000 Units by mouth daily.      Marland Kitchen CITALOPRAM HYDROBROMIDE 40 MG PO TABS Oral Take 40 mg by mouth daily.     Marland Kitchen CLONAZEPAM 0.5 MG PO TABS Oral Take 0.25 mg by mouth. Patient takes (0.25mg ) at 2pm and Patient takes (0.5mg ) at bedtime    . HYDROCODONE-ACETAMINOPHEN 5-325 MG PO TABS Oral Take 2 tablets by mouth 3 (three) times daily.      Marland Kitchen MEMANTINE HCL 5 MG PO TABS Oral Take 5 mg by mouth 2 (two) times daily.      Marland Kitchen NAPROXEN 250 MG PO TABS Oral Take 250 mg by mouth 2 (two) times daily as needed. For  pain    . OMEPRAZOLE 20 MG PO CPDR Oral Take 20 mg by mouth daily.      Marland Kitchen POLYETHYLENE GLYCOL 3350 PO PACK Oral Take 17 g by mouth daily.      Marland Kitchen RANITIDINE HCL 150 MG PO CAPS Oral Take 150 mg by mouth every evening.      Marland Kitchen RIVASTIGMINE 9.5 MG/24HR TD PT24 Transdermal Place 1 patch onto the skin daily.      Marland Kitchen ROSUVASTATIN CALCIUM 10 MG PO TABS Oral Take 10 mg by mouth daily.      Marland Kitchen VITAMIN C 500 MG PO TABS Oral Take 1,000 mg by mouth daily.     Marland Kitchen DEXTROMETHORPHAN-GUAIFENESIN 30-600 MG PO TB12 Oral Take 1 tablet by mouth every 12 (twelve) hours.      . FUROSEMIDE 20 MG PO TABS Oral Take 20 mg by mouth daily as needed. For swelling    . MECLIZINE HCL 25 MG PO TABS Oral Take 25 mg by mouth every 6 (six) hours as needed. for dizziness.    Marland Kitchen PROMETHAZINE HCL 12.5 MG PO TABS  Oral Take 12.5 mg by mouth every 6 (six) hours as needed. For nausea      BP 153/52  Pulse 74  Temp(Src) 98 F (36.7 C) (Oral)  Resp 18  Ht 5\' 6"  (1.676 m)  Wt 150 lb (68.04 kg)  BMI 24.21 kg/m2  SpO2 92%  Physical Exam  Nursing note and vitals reviewed. Constitutional: She is oriented to person, place, and time. She appears well-developed and well-nourished.  HENT:  Head: Normocephalic and atraumatic.  Eyes: Conjunctivae and EOM are normal. Pupils are equal, round, and reactive to light.  Neck: Normal range of motion. Neck supple.  Cardiovascular: Normal rate and regular rhythm.   Pulmonary/Chest: Effort normal and breath sounds normal.       Tender midshaft left clavicle, old  Abdominal: Soft. Bowel sounds are normal.  Musculoskeletal:       Tender left lateral hip  Neurological: She is alert and oriented to person, place, and time.  Skin: Skin is warm and dry.  Psychiatric: She has a normal mood and affect.    ED Course  Procedures (including critical care time)  Labs Reviewed - No data to display Dg Hip Complete Left  03/07/2011  *RADIOLOGY REPORT*  Clinical Data: Fall, hip pain  LEFT HIP - COMPLETE 2+  VIEW  Comparison: Plain films 02/28/2011  Findings: Bilateral total hip arthroplasties are noted.  No evidence of dislocation.  Dedicated view of the left hip demonstrates no evidence of acute fracture.  IMPRESSION: No fracture or dislocation.  Original Report Authenticated By: Genevive Bi, M.D.     1. Fall   2. Contusion of left hip       MDM  X-ray of left hip shows no fracture. Followup with her Dr.        Donnetta Hutching, MD 03/07/11 2229

## 2011-03-07 NOTE — ED Notes (Signed)
On spine board--alert and oriented to person and place--c/o left leg and left clavicular area pain--Does not recall falling--Son at bedside.

## 2011-07-18 ENCOUNTER — Emergency Department (HOSPITAL_COMMUNITY)
Admission: EM | Admit: 2011-07-18 | Discharge: 2011-07-19 | Disposition: A | Discharge status: Home / Self Care | Payer: Medicare Other | Attending: Emergency Medicine | Admitting: Emergency Medicine

## 2011-07-18 ENCOUNTER — Emergency Department (HOSPITAL_COMMUNITY): Payer: Medicare Other

## 2011-07-18 ENCOUNTER — Encounter (HOSPITAL_COMMUNITY): Payer: Self-pay | Admitting: *Deleted

## 2011-07-18 DIAGNOSIS — R509 Fever, unspecified: Secondary | ICD-10-CM | POA: Insufficient documentation

## 2011-07-18 DIAGNOSIS — Z79899 Other long term (current) drug therapy: Secondary | ICD-10-CM | POA: Insufficient documentation

## 2011-07-18 DIAGNOSIS — E785 Hyperlipidemia, unspecified: Secondary | ICD-10-CM | POA: Insufficient documentation

## 2011-07-18 DIAGNOSIS — Y9210 Unspecified residential institution as the place of occurrence of the external cause: Secondary | ICD-10-CM | POA: Insufficient documentation

## 2011-07-18 DIAGNOSIS — I10 Essential (primary) hypertension: Secondary | ICD-10-CM | POA: Insufficient documentation

## 2011-07-18 DIAGNOSIS — Z85038 Personal history of other malignant neoplasm of large intestine: Secondary | ICD-10-CM | POA: Insufficient documentation

## 2011-07-18 DIAGNOSIS — G309 Alzheimer's disease, unspecified: Secondary | ICD-10-CM | POA: Insufficient documentation

## 2011-07-18 DIAGNOSIS — R51 Headache: Secondary | ICD-10-CM | POA: Insufficient documentation

## 2011-07-18 DIAGNOSIS — K219 Gastro-esophageal reflux disease without esophagitis: Secondary | ICD-10-CM | POA: Insufficient documentation

## 2011-07-18 DIAGNOSIS — W19XXXA Unspecified fall, initial encounter: Secondary | ICD-10-CM | POA: Insufficient documentation

## 2011-07-18 DIAGNOSIS — R109 Unspecified abdominal pain: Secondary | ICD-10-CM | POA: Insufficient documentation

## 2011-07-18 DIAGNOSIS — S0990XA Unspecified injury of head, initial encounter: Secondary | ICD-10-CM | POA: Insufficient documentation

## 2011-07-18 DIAGNOSIS — M25559 Pain in unspecified hip: Secondary | ICD-10-CM | POA: Insufficient documentation

## 2011-07-18 DIAGNOSIS — S0003XA Contusion of scalp, initial encounter: Secondary | ICD-10-CM | POA: Insufficient documentation

## 2011-07-18 DIAGNOSIS — F0280 Dementia in other diseases classified elsewhere without behavioral disturbance: Secondary | ICD-10-CM | POA: Insufficient documentation

## 2011-07-18 DIAGNOSIS — S1093XA Contusion of unspecified part of neck, initial encounter: Secondary | ICD-10-CM | POA: Insufficient documentation

## 2011-07-18 DIAGNOSIS — F341 Dysthymic disorder: Secondary | ICD-10-CM | POA: Insufficient documentation

## 2011-07-18 MED ORDER — ACETAMINOPHEN 325 MG PO TABS
650.0000 mg | ORAL_TABLET | Freq: Once | ORAL | Status: AC
  Administered 2011-07-18: 650 mg via ORAL
  Filled 2011-07-18: qty 2

## 2011-07-18 NOTE — ED Notes (Signed)
EMS called to [facility] for fall, pt found lying on floor in dementia unit, pt unable to recall events

## 2011-07-18 NOTE — Discharge Instructions (Signed)
Follow up if any problems °

## 2011-07-18 NOTE — ED Notes (Signed)
Family sitting at bedside.

## 2011-07-18 NOTE — ED Provider Notes (Cosign Needed)
History    Scribed for [name], MD, the patient was seen in room APAH2/APAH2. This chart was scribed by [name].  Pt was seen at 9:21 PM    CSN: [id number]  Arrival date & time 07/18/11  2024   First MD Initiated Contact with Patient 07/18/11 2119      Chief Complaint  Patient presents with  . Fall    (Consider location/radiation/quality/duration/timing/severity/associated sxs/prior treatment) Patient is a 78 y.o. female presenting with fall. The history is provided by a relative (pt was found on the floor). No language interpreter was used.  Fall The accident occurred 1 to 2 hours ago. She fell from a height of 1 to 2 ft. She landed on a hard floor. The point of impact was the head. The pain is present in the head. The pain is at a severity of 1/10. The pain is mild. She was not ambulatory at the scene. There was no drug use involved in the accident. Associated symptoms include a fever.  Level V Caveat applies for dementia.  History provided by EMS and family member.   Kathy Avery is a 78 y.o. female who presents to the Emergency Department for fall at [facility] today.  Per EMS:  Patient was found on the floor by staff at the [facility].  Per family member: patient uses a wheelchair.  Family member reports patient complained of pelvic pain.  Patient denies any current pain.  No treatment prior to arrival.   There was no blood loss.   Patient unable to recall events of fall.       Past Medical History  Diagnosis Date  . Hypertension   . Depression   . Anxiety   . Alzheimer disease   . GERD (gastroesophageal reflux disease)   . Hyperlipidemia   . Pelvic fracture   . Clavicular fracture   . Cancer     colon    Past Surgical History  Procedure Date  . Fracture surgery     pelvis    No family history on file.  History  Substance Use Topics  . Smoking status: Never Smoker   . Smokeless tobacco: Never Used  . Alcohol Use: No    OB History      Grav Para Term Preterm Abortions TAB SAB Ect Mult Living                  Review of Systems  Unable to perform ROS: Dementia  Constitutional: Positive for fever.    Allergies  Morphine and related  Home Medications   Current Outpatient Rx  Name Route Sig Dispense Refill  . ALISKIREN FUMARATE 300 MG PO TABS Oral Take 300 mg by mouth daily.    Marland Kitchen CALCIUM 600/VITAMIN D PO Oral Take 1 tablet by mouth daily.      Marland Kitchen VITAMIN D 1000 UNITS PO CAPS Oral Take 1,000 Units by mouth daily.      Marland Kitchen CITALOPRAM HYDROBROMIDE 40 MG PO TABS Oral Take 40 mg by mouth daily.     Marland Kitchen CLONAZEPAM 0.5 MG PO TABS Oral Take 0.25 mg by mouth. Patient takes (0.25mg ) at 2pm and Patient takes (0.5mg ) at bedtime    . HYDROCODONE-ACETAMINOPHEN 5-325 MG PO TABS Oral Take 1 tablet by mouth 2 (two) times daily. Given at 9am and 4pm    . MELOXICAM 7.5 MG PO TABS Oral Take 7.5 mg by mouth daily.    Marland Kitchen MEMANTINE HCL 5 MG PO TABS Oral  Take 5 mg by mouth 2 (two) times daily.      Marland Kitchen NAPROXEN 250 MG PO TABS Oral Take 250 mg by mouth 2 (two) times daily as needed. For pain    . OMEPRAZOLE 20 MG PO CPDR Oral Take 20 mg by mouth daily.      Marland Kitchen POLYETHYLENE GLYCOL 3350 PO PACK Oral Take 17 g by mouth daily.      Marland Kitchen RANITIDINE HCL 150 MG PO CAPS Oral Take 150 mg by mouth every evening.      Marland Kitchen RIVASTIGMINE 9.5 MG/24HR TD PT24 Transdermal Place 1 patch onto the skin daily.      Marland Kitchen ROSUVASTATIN CALCIUM 10 MG PO TABS Oral Take 10 mg by mouth daily.      Marland Kitchen VITAMIN C 500 MG PO TABS Oral Take 1,000 mg by mouth daily.     Marland Kitchen DM-GUAIFENESIN ER 30-600 MG PO TB12 Oral Take 1 tablet by mouth every 12 (twelve) hours.      . FUROSEMIDE 20 MG PO TABS Oral Take 20 mg by mouth daily as needed. For swelling    . PROMETHAZINE HCL 12.5 MG PO TABS Oral Take 12.5 mg by mouth every 6 (six) hours as needed. For nausea      BP 183/78  Pulse 69  Temp(Src) 98.1 F (36.7 C) (Oral)  Resp 18  SpO2 98%  Physical Exam  Constitutional: She appears  well-developed.  HENT:  Head: Normocephalic.       Left temporal bruising   Eyes: Conjunctivae and EOM are normal. No scleral icterus.  Neck: Neck supple. No thyromegaly present.  Cardiovascular: Normal rate and regular rhythm.  Exam reveals no gallop and no friction rub.   No murmur heard. Pulmonary/Chest: No stridor. She has no wheezes. She has no rales. She exhibits no tenderness.  Abdominal: She exhibits no distension. There is no tenderness. There is no rebound.  Musculoskeletal: Normal range of motion. She exhibits no edema.       Complains of right inguinal pain  Lymphadenopathy:    She has no cervical adenopathy.  Neurological: She is alert. She has normal strength. Coordination normal.       Oriented to Person and place only, mild dementiation    Skin: No rash noted. No erythema.  Psychiatric: She has a normal mood and affect. Her behavior is normal.    ED Course  Procedures (including critical care time)   DIAGNOSTIC STUDIES: Oxygen Saturation is 98% on room air, normal by my interpretation.     COORDINATION OF CARE: 9:25 PM  Physical exam complete.  Will xray pelvis and CT Head and cervical spine.  10:55 PM  CT Head and Cervical spine pending.        LABS / RADIOLOGY:   Labs Reviewed - No data to display Dg Pelvis 1-2 Views  07/18/2011  *RADIOLOGY REPORT*  Clinical Data: Fall.  Pelvic pain  PELVIS - 1-2 VIEW  Comparison: 03/07/2011  Findings: Bones appear osteopenic.  The patient is status post prior bilateral hip arthroplasty.  There is a subacute to chronic fracture deformity involving the left superior pubic rami.  This is new since 03/07/2011.  IMPRESSION:  1.  Subacute to chronic left superior pubic rami fracture. 2.  Status post bilateral hip arthroplasty.  Original Report Authenticated By: Rosealee Albee, M.D.         MDM        MEDICATIONS GIVEN IN THE E.D. Scheduled Meds:   Continuous Infusions:  IMPRESSION: No diagnosis  found.   NEW MEDICATIONS: New Prescriptions   No medications on file      The chart was scribed for me under my direct supervision.  I personally performed the history, physical, and medical decision making and all procedures in the evaluation of this patient.[name], MD 07/18/11 [phone]

## 2011-09-01 ENCOUNTER — Encounter (HOSPITAL_COMMUNITY): Payer: Self-pay

## 2011-09-01 ENCOUNTER — Emergency Department (HOSPITAL_COMMUNITY)
Admission: EM | Admit: 2011-09-01 | Discharge: 2011-09-02 | Disposition: A | Discharge status: Home Health | Payer: Medicare Other | Attending: Emergency Medicine | Admitting: Emergency Medicine

## 2011-09-01 DIAGNOSIS — I10 Essential (primary) hypertension: Secondary | ICD-10-CM | POA: Insufficient documentation

## 2011-09-01 DIAGNOSIS — F341 Dysthymic disorder: Secondary | ICD-10-CM | POA: Insufficient documentation

## 2011-09-01 DIAGNOSIS — K219 Gastro-esophageal reflux disease without esophagitis: Secondary | ICD-10-CM | POA: Insufficient documentation

## 2011-09-01 DIAGNOSIS — E785 Hyperlipidemia, unspecified: Secondary | ICD-10-CM | POA: Insufficient documentation

## 2011-09-01 DIAGNOSIS — F0280 Dementia in other diseases classified elsewhere without behavioral disturbance: Secondary | ICD-10-CM | POA: Insufficient documentation

## 2011-09-01 DIAGNOSIS — Z85038 Personal history of other malignant neoplasm of large intestine: Secondary | ICD-10-CM | POA: Insufficient documentation

## 2011-09-01 DIAGNOSIS — G309 Alzheimer's disease, unspecified: Secondary | ICD-10-CM | POA: Insufficient documentation

## 2011-09-01 DIAGNOSIS — R079 Chest pain, unspecified: Secondary | ICD-10-CM | POA: Insufficient documentation

## 2011-09-01 DIAGNOSIS — Z79899 Other long term (current) drug therapy: Secondary | ICD-10-CM | POA: Insufficient documentation

## 2011-09-01 DIAGNOSIS — D696 Thrombocytopenia, unspecified: Secondary | ICD-10-CM

## 2011-09-01 LAB — CBC
HCT: 37.1 % (ref 36.0–46.0)
Hemoglobin: 11.9 g/dL — ABNORMAL LOW (ref 12.0–15.0)
MCV: 94.2 fL (ref 78.0–100.0)
RBC: 3.94 MIL/uL (ref 3.87–5.11)
WBC: 6.6 10*3/uL (ref 4.0–10.5)

## 2011-09-01 LAB — DIFFERENTIAL
Eosinophils Relative: 4 % (ref 0–5)
Lymphocytes Relative: 20 % (ref 12–46)
Lymphs Abs: 1.3 10*3/uL (ref 0.7–4.0)
Monocytes Absolute: 0.5 10*3/uL (ref 0.1–1.0)
Neutro Abs: 4.5 10*3/uL (ref 1.7–7.7)

## 2011-09-01 LAB — TROPONIN I: Troponin I: <0.3 ng/mL (ref <0.30)

## 2011-09-01 LAB — BASIC METABOLIC PANEL
CO2: 28 mEq/L (ref 19–32)
Calcium: 9.1 mg/dL (ref 8.4–10.5)
Chloride: 104 mEq/L (ref 96–112)
Creatinine, Ser: 0.93 mg/dL (ref 0.50–1.10)
Glucose, Bld: 97 mg/dL (ref 70–99)

## 2011-09-01 NOTE — Discharge Instructions (Signed)
Your caregiver has diagnosed you as having chest pain that is nonspecific for one problem. This means that after looking at you and examining you and ordering tests (such as blood work, chest x-rays and EKG), your caregiver does not believe that the problem is serious enough to need watching in the hospital. This judgment is often made after testing shows no acute heart attack and you are at low risk for sudden acute heart condition. Chest pain comes from many different causes.  Seek immediate medical attention if:   You have severe chest pain, especially if the pain is crushing or pressure-like and spreads to the arms, back, neck, or jaw, or if you have sweating, nausea, shortness of breath. This is an emergency. Don't wait to see if the pain will go away. Get medical help at once. Call 911 immediately. Do not drive herself to the hospital.   Your chest pain gets worse and does not go away with rest.   You have an attack of chest pain lasting longer than usual, despite rest and treatment with the medications your caregiver has prescribed   You awaken from sleep with chest pain or shortness of breath.   You feel faint or dizzy   You have chest pain not typical of your usual pain for which you originally saw your caregiver.  You must have a repeat evaluation within 24 hours for a recheck of your heart.  Please call your doctor this morning to schedule this appointment. If you do not have a family doctor, please see the list of doctors below.  RESOURCE GUIDE  Dental Problems  Patients with Medicaid: [facility]                     [facility] [address]                                           [address] Phone:  [phone]                                                  Phone:  [phone]  If unable to pay or uninsured, contact:  [facility] or [facility] to become qualified for the adult dental clinic.  Chronic Pain  Problems Contact [facility]  [phone] Patients need to be referred by their primary care doctor.  Insufficient Money for Medicine Contact [employer] Way:  call "211" or [facility] 271-5999.  No Primary Care Doctor Call [facility]  832-8000 Other agencies that provide inexpensive medical care    [facility]  [phone]    [facility]  [phone]    [facility]  [phone]    Women's Clinic  [phone]    [facility]  [phone]    [facility]  [phone]  Psychological Services [facility]  [phone] [facility]  [phone] [facility]   800 853-5163 (emergency services [phone])  Substance Abuse Resources [facility]  336-882-2125 [facility] 336-784-9470 [facility] 336-285-9073 [facility] 336-845-3988 Residential & Outpatient Substance Abuse Program  800-659-3381  Abuse/Neglect [place] Child Abuse Hotline (336) 641-3795 [place] Child Abuse Hotline 800-378-5315 (After Hours)  Emergency Shelter [facility] (336) 271-5985  M[facility] (336) 275-9566 [facility] (704) 372-4663  MRSA Hotline #:   832-7006    [place] [facility]  [facility] [place]     [employer]                          [facility] [address] [place]                       [address]      [address]  [place]                                                [place]                            [place] Phone:  [phone]                                   Phone:  [phone]                 Phone:  [phone]  [facility] Phone:  [phone]  [place] Child Abuse Hotline (336) 342-1394 (336) 342-3537 (After Hours)    

## 2011-09-01 NOTE — ED Notes (Signed)
Report called to [facility] at [facility]; patient awaiting transport back.

## 2011-09-01 NOTE — ED Notes (Signed)
Chest pain onset today °

## 2011-09-01 NOTE — ED Provider Notes (Signed)
History     CSN: [id number]  Arrival date & time 09/01/11  2102   First MD Initiated Contact with Patient 09/01/11 2259      Chief Complaint  Patient presents with  . Chest Pain    (Consider location/radiation/quality/duration/timing/severity/associated sxs/prior treatment) HPI Comments: Pt is a pleasant elderly dementia pt who presents with a c/o CP - per the [facility] where she has been staying.  The NH reports brief episode of CP which she doesn't remember, states that it only lasted for a short time per the family member and is currently chest pain-free. She denies any associated shortness of breath or any other pains at this time.  The medical history is significant for Alzheimer's disease, hypertension and hyperlipidemia.  Patient is a 78 y.o. female presenting with chest pain. The history is provided by the patient and a relative. History Limited By: dementia.  Chest Pain     Past Medical History  Diagnosis Date  . Hypertension   . Depression   . Anxiety   . Alzheimer disease   . GERD (gastroesophageal reflux disease)   . Hyperlipidemia   . Pelvic fracture   . Clavicular fracture   . Cancer     colon    Past Surgical History  Procedure Date  . Fracture surgery     pelvis    No family history on file.  History  Substance Use Topics  . Smoking status: Never Smoker   . Smokeless tobacco: Never Used  . Alcohol Use: No    OB History    Grav Para Term Preterm Abortions TAB SAB Ect Mult Living                  Review of Systems  Unable to perform ROS Cardiovascular: Positive for chest pain.    Allergies  Morphine and related  Home Medications   Current Outpatient Rx  Name Route Sig Dispense Refill  . ALISKIREN FUMARATE 300 MG PO TABS Oral Take 300 mg by mouth daily.    Marland Kitchen CALCIUM 600/VITAMIN D PO Oral Take 1 tablet by mouth daily.      Marland Kitchen VITAMIN D 1000 UNITS PO CAPS Oral Take 1,000 Units by mouth daily.      Marland Kitchen CITALOPRAM HYDROBROMIDE 40 MG  PO TABS Oral Take 40 mg by mouth daily.     Marland Kitchen CLONAZEPAM 0.5 MG PO TABS Oral Take 0.25-0.5 mg by mouth 2 (two) times daily. Patient takes (0.25mg ) at 2pm and Patient takes (0.5mg ) at bedtime    . HYDROCODONE-ACETAMINOPHEN 5-325 MG PO TABS Oral Take 1 tablet by mouth 2 (two) times daily. Given at 9am and 4pm    . MELOXICAM 7.5 MG PO TABS Oral Take 7.5 mg by mouth daily.    Marland Kitchen MEMANTINE HCL 5 MG PO TABS Oral Take 5 mg by mouth 2 (two) times daily.      Marland Kitchen OMEPRAZOLE 20 MG PO CPDR Oral Take 20 mg by mouth every morning.     Marland Kitchen POLYETHYLENE GLYCOL 3350 PO PACK Oral Take 17 g by mouth daily.      Marland Kitchen PROMETHAZINE HCL 12.5 MG PO TABS Oral Take 12.5 mg by mouth every 6 (six) hours as needed. For nausea    . RANITIDINE HCL 150 MG PO CAPS Oral Take 150 mg by mouth every evening.      Marland Kitchen RIVASTIGMINE 9.5 MG/24HR TD PT24 Transdermal Place 1 patch onto the skin daily.      Marland Kitchen ROSUVASTATIN CALCIUM 10  MG PO TABS Oral Take 10 mg by mouth daily.      Marland Kitchen VITAMIN C 500 MG PO TABS Oral Take 1,000 mg by mouth daily.     . FUROSEMIDE 20 MG PO TABS Oral Take 20 mg by mouth daily as needed. For swelling    . NAPROXEN 250 MG PO TABS Oral Take 250 mg by mouth 2 (two) times daily as needed. For pain      BP 185/81  Pulse 74  Resp 18  Wt 140 lb (63.504 kg)  SpO2 98%  Physical Exam  Nursing note and vitals reviewed. Constitutional: She appears well-developed and well-nourished. No distress.  HENT:  Head: Normocephalic and atraumatic.  Mouth/Throat: Oropharynx is clear and moist. No oropharyngeal exudate.  Eyes: Conjunctivae and EOM are normal. Pupils are equal, round, and reactive to light. Right eye exhibits no discharge. Left eye exhibits no discharge. No scleral icterus.  Neck: Normal range of motion. Neck supple. No JVD present. No thyromegaly present.  Cardiovascular: Normal rate, regular rhythm, normal heart sounds and intact distal pulses.  Exam reveals no gallop and no friction rub.   No murmur  heard. Pulmonary/Chest: Effort normal and breath sounds normal. No respiratory distress. She has no wheezes. She has no rales.  Abdominal: Soft. Bowel sounds are normal. She exhibits no distension and no mass. There is no tenderness.  Musculoskeletal: Normal range of motion. She exhibits no edema and no tenderness.  Lymphadenopathy:    She has no cervical adenopathy.  Neurological: She is alert. Coordination normal.  Skin: Skin is warm and dry. No rash noted. No erythema.  Psychiatric: She has a normal mood and affect. Her behavior is normal.    ED Course  Procedures (including critical care time)  Labs Reviewed  CBC - Abnormal; Notable for the following:    Hemoglobin 11.9 (*)    Platelets 127 (*)    All other components within normal limits  BASIC METABOLIC PANEL - Abnormal; Notable for the following:    BUN 30 (*)    GFR calc non Af Amer 54 (*)    GFR calc Af Amer 63 (*)    All other components within normal limits  DIFFERENTIAL  TROPONIN I   No results found.   1. Chest pain   2. Thrombocytopenia       MDM  At this time the patient appears in no acute distress, has a blood pressure of 156/80 and has no signs of respiratory distress, peripheral edema and has no complaints of chest pain. According to the EKG as noted by Dr. Ignacia Palma and with which I agree, there does appear to be some nonspecific T waves in leads V2 V3 but according to prior EKG from 02/28/2011 these T waves has not changed.  Laboratory data suggests that the patient has a normal renal function and electrolytes, normal blood counts including hemoglobin other than a slightly depressed platelet count at 127,000, troponin which is normal.  Since the patient is chest pain-free and has normal EKG and normal cardiac enzymes with no prior history of cardiac disease we'll discharge the patient back to her nursing facility with precautions to return should her symptoms worsen.      Vida Roller, MD 09/01/11  [phone]

## 2011-09-01 NOTE — ED Provider Notes (Signed)
9:17 PM  Date: 09/01/2011  Rate:75  Rhythm: normal sinus rhythm  QRS Axis: normal  Intervals: normal  ST/T Wave abnormalities: nonspecific T wave changes  Conduction Disutrbances:none  Narrative Interpretation: Abnormal EKG  Old EKG Reviewed: unchanged    Carleene Cooper III, MD 09/01/11 2118

## 2012-05-01 IMAGING — CR DG HIP (WITH OR WITHOUT PELVIS) 2-3V*L*
3 series · 3 of 3 positions shown · non-contrast
Comparison: Plain films 02/28/2011

CLINICAL DATA: Fall, hip pain

LEFT HIP - COMPLETE 2+ VIEW

[view not recorded (1 of 3)]
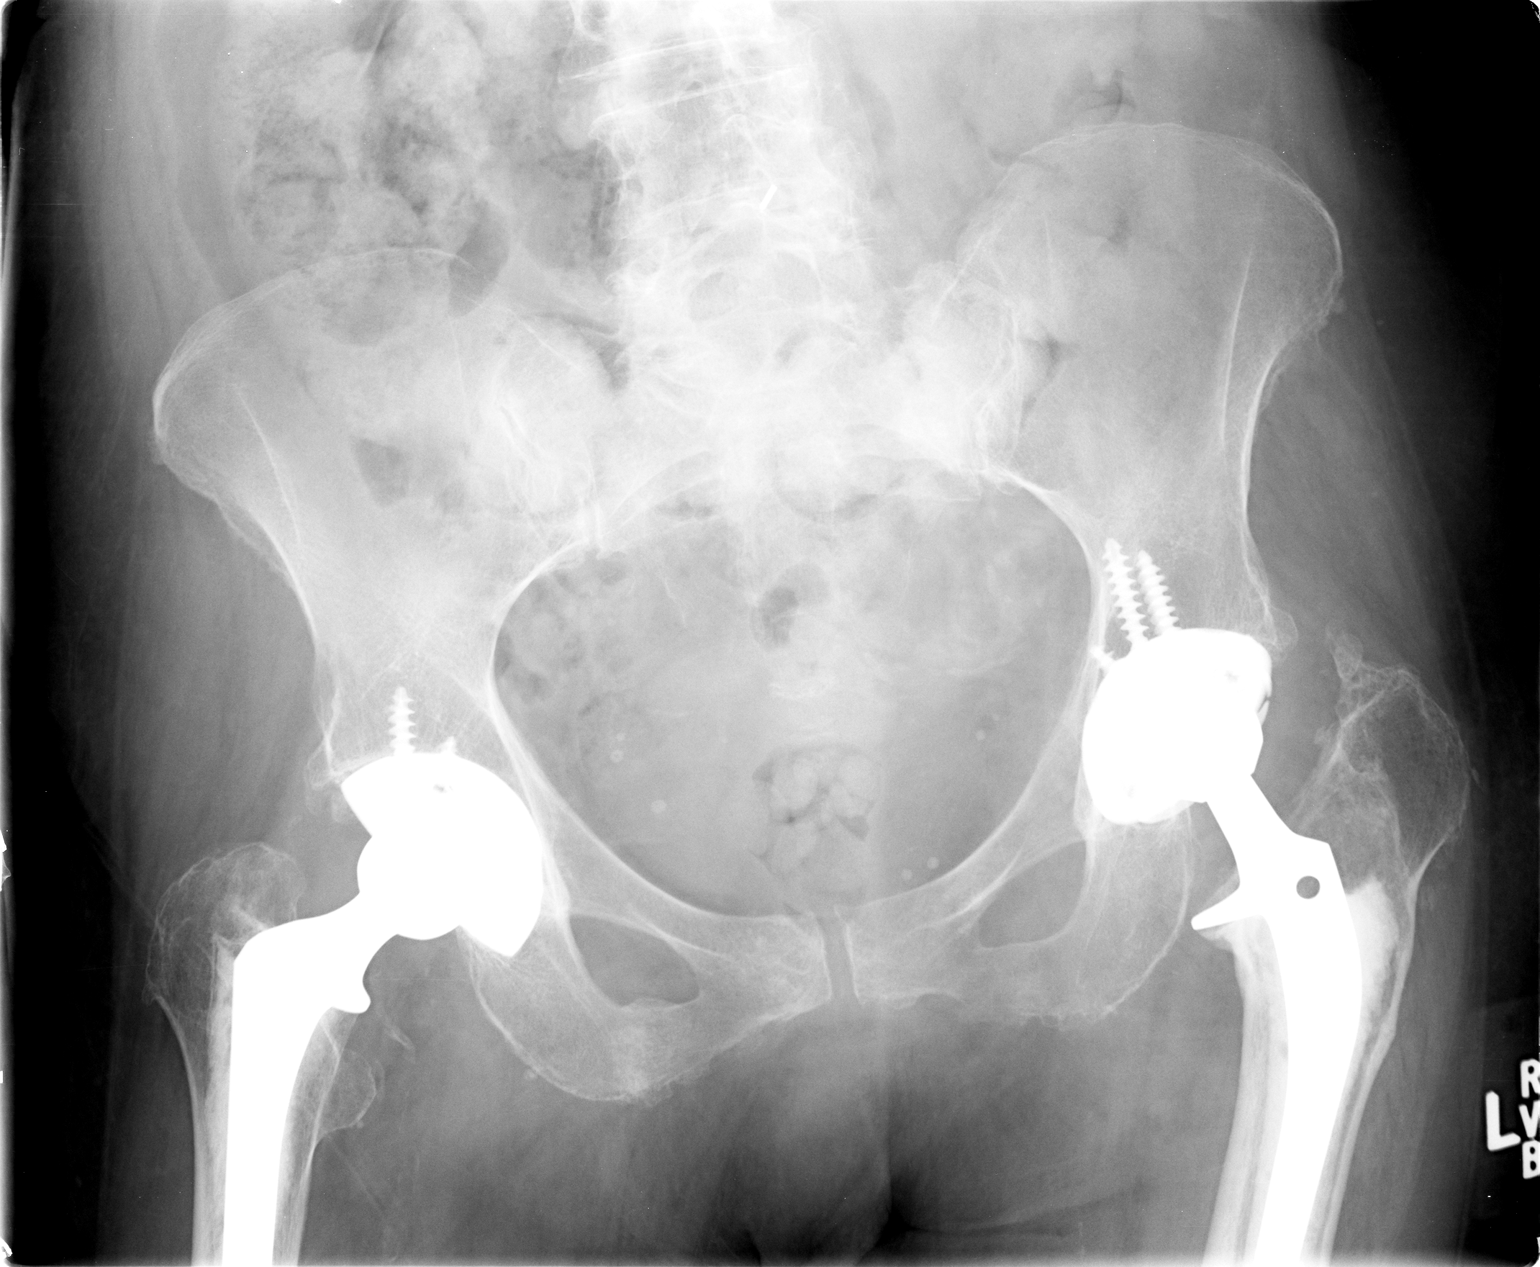

[view not recorded (2 of 3)]
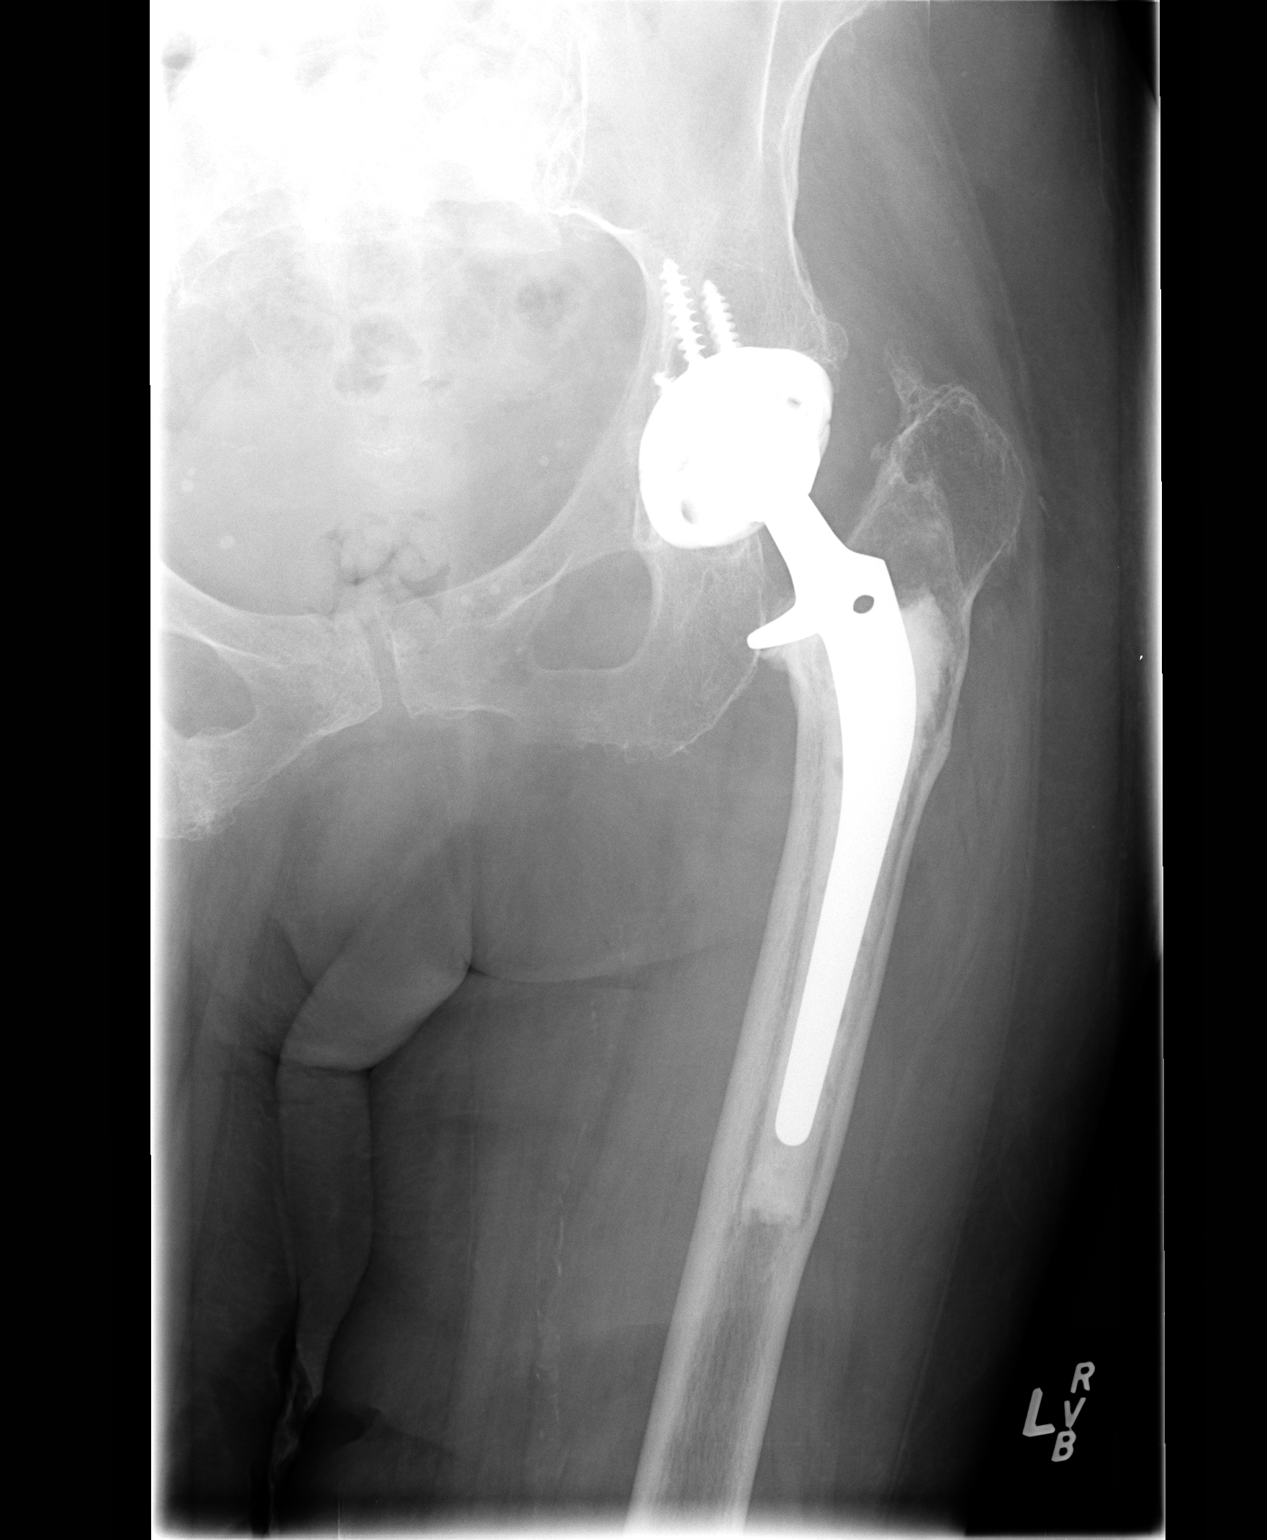

[view not recorded (3 of 3)]
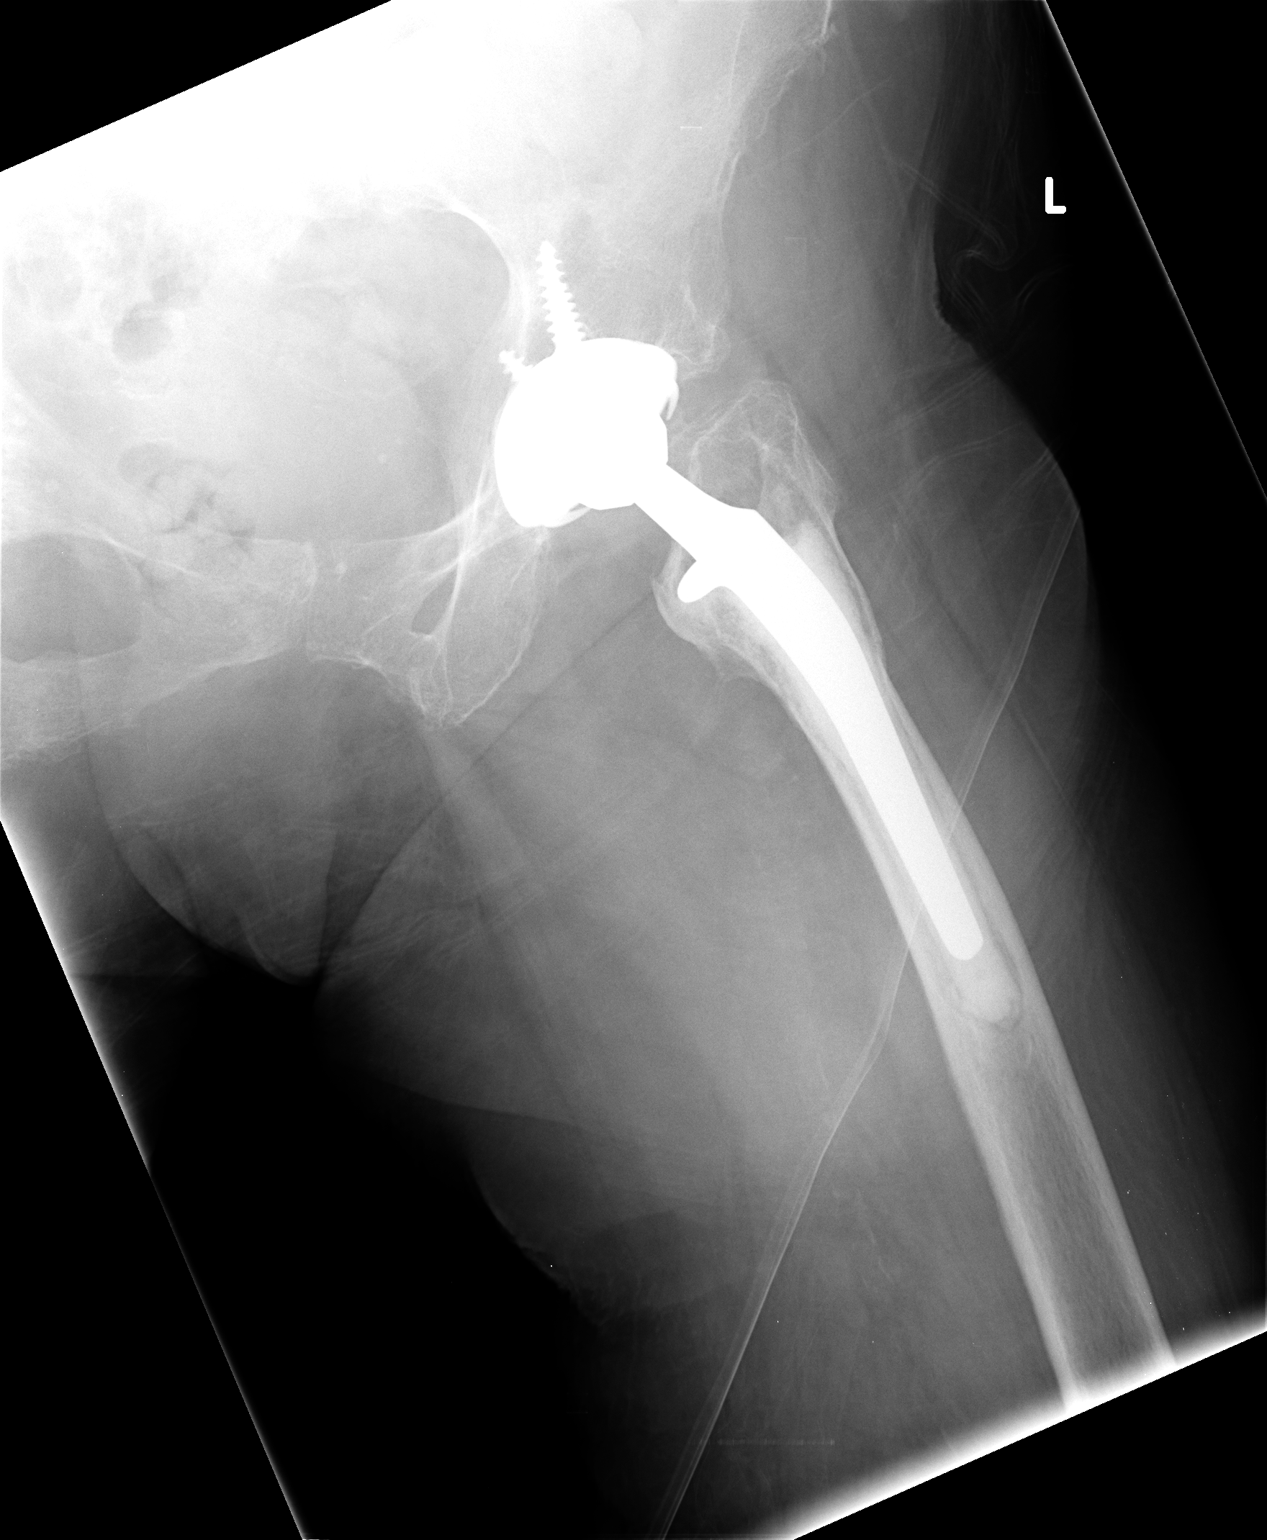

[3 of 3 positions shown; findings below may reference images not displayed]

FINDINGS: Bilateral total hip arthroplasties are noted.  No
evidence of dislocation.  Dedicated view of the left hip
demonstrates no evidence of acute fracture.
IMPRESSION: No fracture or dislocation.

## 2012-05-13 ENCOUNTER — Emergency Department (HOSPITAL_COMMUNITY): Payer: Medicare Other

## 2012-05-13 ENCOUNTER — Encounter (HOSPITAL_COMMUNITY): Payer: Self-pay | Admitting: Emergency Medicine

## 2012-05-13 ENCOUNTER — Emergency Department (HOSPITAL_COMMUNITY)
Admission: EM | Admit: 2012-05-13 | Discharge: 2012-05-13 | Disposition: A | Discharge status: Home / Self Care | Payer: Medicare Other | Attending: Emergency Medicine | Admitting: Emergency Medicine

## 2012-05-13 DIAGNOSIS — K5289 Other specified noninfective gastroenteritis and colitis: Secondary | ICD-10-CM | POA: Insufficient documentation

## 2012-05-13 DIAGNOSIS — K219 Gastro-esophageal reflux disease without esophagitis: Secondary | ICD-10-CM | POA: Insufficient documentation

## 2012-05-13 DIAGNOSIS — F329 Major depressive disorder, single episode, unspecified: Secondary | ICD-10-CM | POA: Insufficient documentation

## 2012-05-13 DIAGNOSIS — K529 Noninfective gastroenteritis and colitis, unspecified: Secondary | ICD-10-CM

## 2012-05-13 DIAGNOSIS — M25559 Pain in unspecified hip: Secondary | ICD-10-CM | POA: Insufficient documentation

## 2012-05-13 DIAGNOSIS — I10 Essential (primary) hypertension: Secondary | ICD-10-CM | POA: Insufficient documentation

## 2012-05-13 DIAGNOSIS — F3289 Other specified depressive episodes: Secondary | ICD-10-CM | POA: Insufficient documentation

## 2012-05-13 DIAGNOSIS — R6883 Chills (without fever): Secondary | ICD-10-CM | POA: Insufficient documentation

## 2012-05-13 DIAGNOSIS — E785 Hyperlipidemia, unspecified: Secondary | ICD-10-CM | POA: Insufficient documentation

## 2012-05-13 DIAGNOSIS — R059 Cough, unspecified: Secondary | ICD-10-CM | POA: Insufficient documentation

## 2012-05-13 DIAGNOSIS — Z79899 Other long term (current) drug therapy: Secondary | ICD-10-CM | POA: Insufficient documentation

## 2012-05-13 DIAGNOSIS — Z8781 Personal history of (healed) traumatic fracture: Secondary | ICD-10-CM | POA: Insufficient documentation

## 2012-05-13 DIAGNOSIS — F0280 Dementia in other diseases classified elsewhere without behavioral disturbance: Secondary | ICD-10-CM | POA: Insufficient documentation

## 2012-05-13 DIAGNOSIS — Z791 Long term (current) use of non-steroidal anti-inflammatories (NSAID): Secondary | ICD-10-CM | POA: Insufficient documentation

## 2012-05-13 DIAGNOSIS — R05 Cough: Secondary | ICD-10-CM | POA: Insufficient documentation

## 2012-05-13 DIAGNOSIS — R197 Diarrhea, unspecified: Secondary | ICD-10-CM | POA: Insufficient documentation

## 2012-05-13 DIAGNOSIS — F411 Generalized anxiety disorder: Secondary | ICD-10-CM | POA: Insufficient documentation

## 2012-05-13 DIAGNOSIS — R569 Unspecified convulsions: Secondary | ICD-10-CM | POA: Insufficient documentation

## 2012-05-13 DIAGNOSIS — G309 Alzheimer's disease, unspecified: Secondary | ICD-10-CM | POA: Insufficient documentation

## 2012-05-13 DIAGNOSIS — Z85038 Personal history of other malignant neoplasm of large intestine: Secondary | ICD-10-CM | POA: Insufficient documentation

## 2012-05-13 DIAGNOSIS — M79609 Pain in unspecified limb: Secondary | ICD-10-CM | POA: Insufficient documentation

## 2012-05-13 LAB — URINALYSIS, ROUTINE W REFLEX MICROSCOPIC
Leukocytes, UA: NEGATIVE
Nitrite: NEGATIVE
Specific Gravity, Urine: 1.025 (ref 1.005–1.030)
Urobilinogen, UA: 0.2 mg/dL (ref 0.0–1.0)
pH: 6 (ref 5.0–8.0)

## 2012-05-13 LAB — CBC WITH DIFFERENTIAL/PLATELET
Basophils Relative: 0 % (ref 0–1)
Eosinophils Absolute: 0.1 10*3/uL (ref 0.0–0.7)
Eosinophils Relative: 1 % (ref 0–5)
Hemoglobin: 12.7 g/dL (ref 12.0–15.0)
Lymphs Abs: 0.5 10*3/uL — ABNORMAL LOW (ref 0.7–4.0)
MCH: 30.2 pg (ref 26.0–34.0)
MCHC: 32.2 g/dL (ref 30.0–36.0)
MCV: 94 fL (ref 78.0–100.0)
Monocytes Relative: 5 % (ref 3–12)
Neutrophils Relative %: 90 % — ABNORMAL HIGH (ref 43–77)
Platelets: 145 10*3/uL — ABNORMAL LOW (ref 150–400)

## 2012-05-13 LAB — URINE MICROSCOPIC-ADD ON

## 2012-05-13 LAB — COMPREHENSIVE METABOLIC PANEL
Albumin: 4.2 g/dL (ref 3.5–5.2)
Alkaline Phosphatase: 62 U/L (ref 39–117)
BUN: 44 mg/dL — ABNORMAL HIGH (ref 6–23)
Calcium: 9.5 mg/dL (ref 8.4–10.5)
GFR calc Af Amer: 42 mL/min — ABNORMAL LOW (ref >90)
Glucose, Bld: 122 mg/dL — ABNORMAL HIGH (ref 70–99)
Potassium: 4.6 mEq/L (ref 3.5–5.1)
Total Protein: 6.9 g/dL (ref 6.0–8.3)

## 2012-05-13 LAB — TROPONIN I: Troponin I: <0.3 ng/mL (ref <0.30)

## 2012-05-13 MED ORDER — ONDANSETRON 8 MG PO TBDP: ORAL_TABLET | ORAL | Status: DC

## 2012-05-13 MED ORDER — KETOROLAC TROMETHAMINE 30 MG/ML IJ SOLN
30.0000 mg | Freq: Once | INTRAMUSCULAR | Status: AC
Start: 2012-05-13 — End: 2012-05-13
  Administered 2012-05-13: 30 mg via INTRAVENOUS
  Filled 2012-05-13: qty 1

## 2012-05-13 MED ORDER — ONDANSETRON HCL 4 MG/2ML IJ SOLN
4.0000 mg | Freq: Once | INTRAMUSCULAR | Status: AC
  Administered 2012-05-13: 4 mg via INTRAVENOUS
  Filled 2012-05-13: qty 2

## 2012-05-13 NOTE — ED Provider Notes (Signed)
History    This chart was scribed for [name], MD, MD by Smitty Pluck, ED Scribe. The patient was seen in room APA14 and the patient's care was started at 12:16 PM.   CSN: [id number]  Arrival date & time 05/13/12  1114      Chief Complaint  Patient presents with  . Near Syncope  . Diarrhea  . Seizures    (Consider location/radiation/quality/duration/timing/severity/associated sxs/prior treatment) The history is provided by the patient. No language interpreter was used.   Kathy Avery is a 78 y.o. female with hx of Alzheimer disease and HTN who presents to the Emergency Department BIB EMS from [facility] due to constant chills onset today. Per EMS pt was being assisted from toilet and had brief period of shaking and near syncope but nursing staff reports she returned to baseline quickly. Pt reports that she has cough, hip pain and leg pain. Pt denies abdominal pain, nausea, vomiting, diarrhea and any other pain. Pt has hx of colon cancer and had section of colon removed 30 years ago.   PCP Dr. Margo Aye Past Medical History  Diagnosis Date  . Hypertension   . Depression   . Anxiety   . Alzheimer disease   . GERD (gastroesophageal reflux disease)   . Hyperlipidemia   . Pelvic fracture   . Clavicular fracture   . Cancer     colon    Past Surgical History  Procedure Date  . Fracture surgery     pelvis    History reviewed. No pertinent family history.  History  Substance Use Topics  . Smoking status: Never Smoker   . Smokeless tobacco: Never Used  . Alcohol Use: No    OB History    Grav Para Term Preterm Abortions TAB SAB Ect Mult Living                  Review of Systems  All other systems reviewed and are negative.   10 Systems reviewed and all are negative for acute change except as noted in the HPI.   Allergies  Morphine and related  Home Medications   Current Outpatient Rx  Name  Route  Sig  Dispense  Refill  . ALISKIREN FUMARATE 300 MG PO  TABS   Oral   Take 300 mg by mouth daily.         Marland Kitchen CALCIUM 600/VITAMIN D PO   Oral   Take 1 tablet by mouth daily.           Marland Kitchen VITAMIN D 1000 UNITS PO CAPS   Oral   Take 1,000 Units by mouth daily.           Marland Kitchen CITALOPRAM HYDROBROMIDE 40 MG PO TABS   Oral   Take 40 mg by mouth daily.          Marland Kitchen HYDROCODONE-ACETAMINOPHEN 5-325 MG PO TABS   Oral   Take 1 tablet by mouth 2 (two) times daily. Given at 9am and 4pm         . HYDROXYZINE HCL 25 MG PO TABS   Oral   Take 25 mg by mouth daily.         . MELOXICAM 7.5 MG PO TABS   Oral   Take 7.5 mg by mouth daily.         Marland Kitchen MEMANTINE HCL 5 MG PO TABS   Oral   Take 5 mg by mouth 2 (two) times daily.           Marland Kitchen  MENTHOL 3 MG MT LOZG   Oral   Take 1 lozenge by mouth every 6 (six) hours as needed. For sore throat         . OMEPRAZOLE 20 MG PO CPDR   Oral   Take 20 mg by mouth every morning.          Marland Kitchen POLYETHYLENE GLYCOL 3350 PO PACK   Oral   Take 17 g by mouth daily.           Marland Kitchen PROMETHAZINE HCL 12.5 MG PO TABS   Oral   Take 12.5 mg by mouth every 6 (six) hours as needed. For nausea         . RANITIDINE HCL 150 MG PO CAPS   Oral   Take 150 mg by mouth every evening.           Marland Kitchen RIVASTIGMINE 9.5 MG/24HR TD PT24   Transdermal   Place 1 patch onto the skin daily.           Marland Kitchen ROSUVASTATIN CALCIUM 10 MG PO TABS   Oral   Take 10 mg by mouth daily.           Marland Kitchen VITAMIN C 500 MG PO TABS   Oral   Take 1,000 mg by mouth daily.            BP 137/69  Pulse 84  Temp 97.5 F (36.4 C) (Oral)  Resp 20  Wt 140 lb (63.504 kg)  SpO2 97%  Physical Exam  Nursing note and vitals reviewed. Constitutional: She is oriented to person, place, and time. She appears well-developed and well-nourished. No distress.  HENT:  Head: Normocephalic and atraumatic.  Eyes: EOM are normal. Pupils are equal, round, and reactive to light.  Neck: Normal range of motion. Neck supple. No tracheal deviation  present.  Cardiovascular: Normal rate, regular rhythm and normal heart sounds.   Pulmonary/Chest: Effort normal. No respiratory distress.  Abdominal: Soft. She exhibits no distension.  Musculoskeletal: Normal range of motion. She exhibits no edema and no tenderness.  Neurological: She is alert and oriented to person, place, and time.  Skin: Skin is warm and dry.  Psychiatric: She has a normal mood and affect. Her behavior is normal.    ED Course  Procedures (including critical care time) DIAGNOSTIC STUDIES: Oxygen Saturation is 97% on room air, normal by my interpretation.    COORDINATION OF CARE: 12:20 PM Discussed ED treatment with pt  12:23 PM Ordered:  Medications  menthol-cetylpyridinium (CEPACOL) 3 MG lozenge (not administered)  hydrOXYzine (ATARAX/VISTARIL) 25 MG tablet (not administered)  ondansetron (ZOFRAN) injection 4 mg (not administered)  ketorolac (TORADOL) 30 MG/ML injection 30 mg (not administered)        Labs Reviewed  CBC WITH DIFFERENTIAL  COMPREHENSIVE METABOLIC PANEL  TROPONIN I  URINALYSIS, ROUTINE W REFLEX MICROSCOPIC   No results found.   No diagnosis found.   Date: 05/13/2012  Rate: 81  Rhythm: normal sinus rhythm  QRS Axis: right  Intervals: normal  ST/T Wave abnormalities: normal  Conduction Disutrbances:nonspecific intraventricular conduction delay  Narrative Interpretation:   Old EKG Reviewed: changes noted    MDM  The patient presents from [facility] for evaluation of chills, nausea, and vomiting.  There is a stomach virus within the resident population there.  She was given zofran and fluids and feels much better.  The workup reveals a wbc of 13.5, slight elevation of bun and cr consistent with mild dehydration.  Upon re-examination, she  remains stable and appropriate for discharge.  No other source for infection has been identified with chest xray and urine.      I personally performed the services described in this  documentation, which was scribed in my presence. The recorded information has been reviewed and is accurate.      [name], MD 05/13/12 [phone]

## 2012-05-13 NOTE — ED Notes (Signed)
[employer] and advised they will be sending someone to pickup patient

## 2012-05-13 NOTE — ED Notes (Signed)
Per ems pt was being assisted from commode with aide and had a brief period of stiffiness and shaking. Pt was assisted to bed without falling. On ems arrival pt was clammy and pale. Pt alert at this time. Reports from ems that [facility] where she lives has outbreak of norovirus.

## 2012-05-16 LAB — URINE CULTURE

## 2012-06-05 ENCOUNTER — Encounter (HOSPITAL_COMMUNITY): Payer: Self-pay | Admitting: Emergency Medicine

## 2012-06-05 ENCOUNTER — Emergency Department (HOSPITAL_COMMUNITY): Payer: Medicare Other

## 2012-06-05 ENCOUNTER — Emergency Department (HOSPITAL_COMMUNITY)
Admission: EM | Admit: 2012-06-05 | Discharge: 2012-06-05 | Disposition: A | Discharge status: Skilled Nursing Facility | Payer: Medicare Other | Attending: Emergency Medicine | Admitting: Emergency Medicine

## 2012-06-05 DIAGNOSIS — F0280 Dementia in other diseases classified elsewhere without behavioral disturbance: Secondary | ICD-10-CM | POA: Insufficient documentation

## 2012-06-05 DIAGNOSIS — S0081XA Abrasion of other part of head, initial encounter: Secondary | ICD-10-CM

## 2012-06-05 DIAGNOSIS — G309 Alzheimer's disease, unspecified: Secondary | ICD-10-CM | POA: Insufficient documentation

## 2012-06-05 DIAGNOSIS — E785 Hyperlipidemia, unspecified: Secondary | ICD-10-CM | POA: Insufficient documentation

## 2012-06-05 DIAGNOSIS — W050XXA Fall from non-moving wheelchair, initial encounter: Secondary | ICD-10-CM | POA: Insufficient documentation

## 2012-06-05 DIAGNOSIS — Z85038 Personal history of other malignant neoplasm of large intestine: Secondary | ICD-10-CM | POA: Insufficient documentation

## 2012-06-05 DIAGNOSIS — W19XXXA Unspecified fall, initial encounter: Secondary | ICD-10-CM

## 2012-06-05 DIAGNOSIS — IMO0002 Reserved for concepts with insufficient information to code with codable children: Secondary | ICD-10-CM | POA: Insufficient documentation

## 2012-06-05 DIAGNOSIS — K219 Gastro-esophageal reflux disease without esophagitis: Secondary | ICD-10-CM | POA: Insufficient documentation

## 2012-06-05 DIAGNOSIS — Z8659 Personal history of other mental and behavioral disorders: Secondary | ICD-10-CM | POA: Insufficient documentation

## 2012-06-05 DIAGNOSIS — Z79899 Other long term (current) drug therapy: Secondary | ICD-10-CM | POA: Insufficient documentation

## 2012-06-05 DIAGNOSIS — I10 Essential (primary) hypertension: Secondary | ICD-10-CM | POA: Insufficient documentation

## 2012-06-05 DIAGNOSIS — Z8781 Personal history of (healed) traumatic fracture: Secondary | ICD-10-CM | POA: Insufficient documentation

## 2012-06-05 DIAGNOSIS — Y9389 Activity, other specified: Secondary | ICD-10-CM | POA: Insufficient documentation

## 2012-06-05 DIAGNOSIS — Y9210 Unspecified residential institution as the place of occurrence of the external cause: Secondary | ICD-10-CM | POA: Insufficient documentation

## 2012-06-05 NOTE — ED Notes (Signed)
Pt complains of lower abdominal cramping, lower back pain, and a steady heavy flow of blood and clots that started this morning. States she is [redacted] weeks pregnant and states she was diagnosed pregnant by a physician.

## 2012-06-05 NOTE — ED Notes (Signed)
Pt changed into clothes and placed in brief.

## 2012-06-05 NOTE — ED Provider Notes (Signed)
History     CSN: [id number]  Arrival date & time 06/05/12  1654   First MD Initiated Contact with Patient 06/05/12 1716      Chief Complaint  Patient presents with  . Fall    The history is provided by the EMS personnel, the nursing home and a relative. The history is limited by the condition of the patient (Hx dementia).  Pt was seen at 1730.  Per EMS, NH report and family, pt fell while trying to get out of her wheelchair PTA.  Pt's family states she "fell against the wall" and "might have just slid to the floor." No reported LOC.  Pt with significant hx of dementia, does not recall events.  Pt states she "feels fine" and denies any complaints.     Past Medical History  Diagnosis Date  . Hypertension   . Depression   . Anxiety   . Alzheimer disease   . GERD (gastroesophageal reflux disease)   . Hyperlipidemia   . Pelvic fracture   . Clavicular fracture   . Cancer     colon    Past Surgical History  Procedure Date  . Fracture surgery     pelvis    History  Substance Use Topics  . Smoking status: Never Smoker   . Smokeless tobacco: Never Used  . Alcohol Use: No      Review of Systems  Unable to perform ROS: Dementia    Allergies  Morphine and related  Home Medications   Current Outpatient Rx  Name  Route  Sig  Dispense  Refill  . ALISKIREN FUMARATE 300 MG PO TABS   Oral   Take 300 mg by mouth daily.         Marland Kitchen CALCIUM 600/VITAMIN D PO   Oral   Take 1 tablet by mouth daily.           Marland Kitchen VITAMIN D 1000 UNITS PO CAPS   Oral   Take 1,000 Units by mouth daily.           Marland Kitchen CITALOPRAM HYDROBROMIDE 40 MG PO TABS   Oral   Take 40 mg by mouth daily.          Marland Kitchen HYDROCODONE-ACETAMINOPHEN 5-325 MG PO TABS   Oral   Take 1 tablet by mouth 2 (two) times daily. Given at 9am and 4pm         . HYDROXYZINE HCL 25 MG PO TABS   Oral   Take 25 mg by mouth daily.         . MELOXICAM 7.5 MG PO TABS   Oral   Take 7.5 mg by mouth daily.         Marland Kitchen  MEMANTINE HCL 5 MG PO TABS   Oral   Take 5 mg by mouth 2 (two) times daily.           Marland Kitchen OMEPRAZOLE 20 MG PO CPDR   Oral   Take 20 mg by mouth every morning.          Marland Kitchen ONDANSETRON 8 MG PO TBDP   Oral   Take 8 mg by mouth every 8 (eight) hours as needed. nausea         . POLYETHYLENE GLYCOL 3350 PO PACK   Oral   Take 17 g by mouth daily.           Marland Kitchen RANITIDINE HCL 150 MG PO CAPS   Oral   Take 150 mg by mouth every  evening.           Marland Kitchen RIVASTIGMINE 9.5 MG/24HR TD PT24   Transdermal   Place 1 patch onto the skin daily.           Marland Kitchen ROSUVASTATIN CALCIUM 10 MG PO TABS   Oral   Take 10 mg by mouth daily.           Marland Kitchen VITAMIN C 500 MG PO TABS   Oral   Take 1,000 mg by mouth daily.          Marland Kitchen MENTHOL 3 MG MT LOZG   Oral   Take 1 lozenge by mouth every 6 (six) hours as needed. For sore throat         . PROMETHAZINE HCL 12.5 MG PO TABS   Oral   Take 12.5 mg by mouth every 6 (six) hours as needed. For nausea           BP 152/59  Pulse 63  Temp 98.7 F (37.1 C) (Oral)  Resp 16  SpO2 92%  Physical Exam 1735: Physical examination: Vital signs and O2 SAT: Reviewed; Constitutional: Well developed, Well nourished, Well hydrated, In no acute distress; Head and Face: Normocephalic, +very small superficial abrasion to right lateral forehead proximal to eyebrow; Eyes: EOMI, PERRL, No scleral icterus; ENMT: Mouth and pharynx normal, Left TM normal, Right TM normal, Mucous membranes moist; Neck: Immobilized in C-collar, Trachea midline; Spine: Immobilized on spineboard, No midline CS, TS, LS tenderness.; Cardiovascular: Regular rate and rhythm, No gallop; Respiratory: Breath sounds clear & equal bilaterally, No rales, rhonchi, wheezes, Normal respiratory effort/excursion; Chest: Nontender, No deformity, Movement normal, No crepitus, No abrasions or ecchymosis.; Abdomen: Soft, Nontender, Nondistended, Normal bowel sounds, No abrasions or ecchymosis.; Genitourinary: No CVA  tenderness;; Extremities: No deformity, Full range of motion major/large joints of bilat UE's and LE's without pain or tenderness to palp, Neurovascularly intact, Pulses normal, No tenderness, No edema, Pelvis stable; Neuro: Awake, alert, confused per baseline dementia. Major CN grossly intact. Speech clear. No facial droop. Moves all ext well spontaneously and to command without apparent gross focal motor deficits.; Skin: Color normal, Warm, Dry    ED Course  Procedures   1740:  Pt arrived to ED with LSB and c-collar in place.  Multiple ED staff at bedside to log roll pt off LSB while maintaining cervical spinal immobilization.  LSB removed, c-collar remains in place.  1945:  No midline CS tenderness, FROM CS without midline tenderness. No apparent NMS changes.  C-collar removed.  No acute fx.  Facial abrasion is very superficial, hemostatic, and small.  Will send back to NH.  Dx and testing d/w pt's family.  Questions answered.  Verb understanding, agreeable to d/c back to NH with outpt f/u   MDM  MDM Reviewed: previous chart, nursing note and vitals Interpretation: x-ray and CT scan    Dg Pelvis 1-2 Views 06/05/2012  *RADIOLOGY REPORT*  Clinical Data: Fall.  PELVIS - 1-2 VIEW  Comparison: 07/18/2011  Findings: Single view of the pelvis was obtained.  The patient has bilateral hip replacements.  The alignment of the hip replacements are grossly unchanged.  No evidence for a gross periprosthetic fracture.  The pelvic bony ring is intact. Stable sclerosis in the left superior pubic ramus/acetabular region.  IMPRESSION: No gross bony abnormality to the pelvis.   Original Report Authenticated By: Richarda Overlie, M.D.    Dg Ankle Complete Left 06/05/2012  *RADIOLOGY REPORT*  Clinical Data: Fall at nursing home.  LEFT ANKLE COMPLETE - 3+ VIEW  Comparison: None.  Findings: Three views of the left ankle were obtained.  The left ankle is located.  There is no evidence for an acute fracture. There is  spurring along the dorsal aspect of the talus bone.  IMPRESSION: No acute bony abnormality to the left ankle.   Original Report Authenticated By: Richarda Overlie, M.D.    Ct Head Wo Contrast 06/05/2012  *RADIOLOGY REPORT*  Clinical Data:  Larey Seat.  Hit head.  CT HEAD WITHOUT CONTRAST CT CERVICAL SPINE WITHOUT CONTRAST  Technique:  Multidetector CT imaging of the head and cervical spine was performed following the standard protocol without intravenous contrast.  Multiplanar CT image reconstructions of the cervical spine were also generated.  Comparison:  Multiple previous studies.  CT HEAD  Findings: Stable age related cerebral atrophy, ventriculomegaly and periventricular white matter disease.  Remote lacunar type basal ganglia infarcts are noted.  No extra-axial fluid collections.  No CT findings for acute hemispheric infarction and/or intracranial hemorrhage.  No mass lesions.  The brainstem and cerebellum grossly normal and stable.  No acute skull fracture.  The paranasal sinuses and mastoid air cells are clear.  The globes are intact.  IMPRESSION:  1.  Stable age related cerebral atrophy, ventriculomegaly and periventricular white matter disease. 2.  No acute intracranial findings or skull fracture.  CT CERVICAL SPINE  Findings: Stable alignment of the cervical vertebral bodies.  No acute fracture or abnormal prevertebral soft tissue swelling.  The facets are normally aligned.  Stable facet disease.  No facet or laminar fractures.  The skull base C1 and C1-2 articulations are maintained.  Stable degenerative changes at C1-2.  IMPRESSION: Normal alignment and no acute bony findings.   Original Report Authenticated By: Rudie Meyer, M.D.    Ct Cervical Spine Wo Contrast 06/05/2012  *RADIOLOGY REPORT*  Clinical Data:  Larey Seat.  Hit head.  CT HEAD WITHOUT CONTRAST CT CERVICAL SPINE WITHOUT CONTRAST  Technique:  Multidetector CT imaging of the head and cervical spine was performed following the standard protocol without  intravenous contrast.  Multiplanar CT image reconstructions of the cervical spine were also generated.  Comparison:  Multiple previous studies.  CT HEAD  Findings: Stable age related cerebral atrophy, ventriculomegaly and periventricular white matter disease.  Remote lacunar type basal ganglia infarcts are noted.  No extra-axial fluid collections.  No CT findings for acute hemispheric infarction and/or intracranial hemorrhage.  No mass lesions.  The brainstem and cerebellum grossly normal and stable.  No acute skull fracture.  The paranasal sinuses and mastoid air cells are clear.  The globes are intact.  IMPRESSION:  1.  Stable age related cerebral atrophy, ventriculomegaly and periventricular white matter disease. 2.  No acute intracranial findings or skull fracture.  CT CERVICAL SPINE  Findings: Stable alignment of the cervical vertebral bodies.  No acute fracture or abnormal prevertebral soft tissue swelling.  The facets are normally aligned.  Stable facet disease.  No facet or laminar fractures.  The skull base C1 and C1-2 articulations are maintained.  Stable degenerative changes at C1-2.  IMPRESSION: Normal alignment and no acute bony findings.   Original Report Authenticated By: Rudie Meyer, M.D.                Laray Anger, DO 06/07/12 1342

## 2012-06-05 NOTE — ED Notes (Addendum)
C collar removed due to clear head CT and CT C Spine. Pt resting comfortably, alert at this time. Son is in room with patient at this time.

## 2012-06-05 NOTE — ED Notes (Signed)
Reported to [place], [place] at [facility]. They are now sending someone to pick the patient up

## 2012-06-05 NOTE — ED Notes (Signed)
Pt resident of [facility]. Larey Seat trying to get into w/c. Pt from alzheimers unit. Pt fully immobilized. Pt states "i dont know" to most questions. Pain to L thumb with palpation. Pain to lower anterior left leg with palpation. nad at this time. Small scratch to side of r eye. Moving all extremities.

## 2012-07-24 ENCOUNTER — Emergency Department (HOSPITAL_COMMUNITY): Payer: Medicare Other

## 2012-07-24 ENCOUNTER — Emergency Department (HOSPITAL_COMMUNITY)
Admission: EM | Admit: 2012-07-24 | Discharge: 2012-07-24 | Disposition: A | Discharge status: Home / Self Care | Payer: Medicare Other | Attending: Emergency Medicine | Admitting: Emergency Medicine

## 2012-07-24 ENCOUNTER — Encounter (HOSPITAL_COMMUNITY): Payer: Self-pay

## 2012-07-24 DIAGNOSIS — G309 Alzheimer's disease, unspecified: Secondary | ICD-10-CM | POA: Insufficient documentation

## 2012-07-24 DIAGNOSIS — S59909A Unspecified injury of unspecified elbow, initial encounter: Secondary | ICD-10-CM | POA: Insufficient documentation

## 2012-07-24 DIAGNOSIS — F0390 Unspecified dementia without behavioral disturbance: Secondary | ICD-10-CM | POA: Insufficient documentation

## 2012-07-24 DIAGNOSIS — W19XXXA Unspecified fall, initial encounter: Secondary | ICD-10-CM

## 2012-07-24 DIAGNOSIS — S6990XA Unspecified injury of unspecified wrist, hand and finger(s), initial encounter: Secondary | ICD-10-CM | POA: Insufficient documentation

## 2012-07-24 DIAGNOSIS — F329 Major depressive disorder, single episode, unspecified: Secondary | ICD-10-CM | POA: Insufficient documentation

## 2012-07-24 DIAGNOSIS — IMO0002 Reserved for concepts with insufficient information to code with codable children: Secondary | ICD-10-CM | POA: Insufficient documentation

## 2012-07-24 DIAGNOSIS — Y9389 Activity, other specified: Secondary | ICD-10-CM | POA: Insufficient documentation

## 2012-07-24 DIAGNOSIS — I10 Essential (primary) hypertension: Secondary | ICD-10-CM | POA: Insufficient documentation

## 2012-07-24 DIAGNOSIS — M549 Dorsalgia, unspecified: Secondary | ICD-10-CM

## 2012-07-24 DIAGNOSIS — S59919A Unspecified injury of unspecified forearm, initial encounter: Secondary | ICD-10-CM | POA: Insufficient documentation

## 2012-07-24 DIAGNOSIS — E785 Hyperlipidemia, unspecified: Secondary | ICD-10-CM | POA: Insufficient documentation

## 2012-07-24 DIAGNOSIS — Z85038 Personal history of other malignant neoplasm of large intestine: Secondary | ICD-10-CM | POA: Insufficient documentation

## 2012-07-24 DIAGNOSIS — Z87828 Personal history of other (healed) physical injury and trauma: Secondary | ICD-10-CM | POA: Insufficient documentation

## 2012-07-24 DIAGNOSIS — F411 Generalized anxiety disorder: Secondary | ICD-10-CM | POA: Insufficient documentation

## 2012-07-24 DIAGNOSIS — F3289 Other specified depressive episodes: Secondary | ICD-10-CM | POA: Insufficient documentation

## 2012-07-24 DIAGNOSIS — W1809XA Striking against other object with subsequent fall, initial encounter: Secondary | ICD-10-CM | POA: Insufficient documentation

## 2012-07-24 DIAGNOSIS — Z79899 Other long term (current) drug therapy: Secondary | ICD-10-CM | POA: Insufficient documentation

## 2012-07-24 DIAGNOSIS — Y929 Unspecified place or not applicable: Secondary | ICD-10-CM | POA: Insufficient documentation

## 2012-07-24 DIAGNOSIS — F0280 Dementia in other diseases classified elsewhere without behavioral disturbance: Secondary | ICD-10-CM | POA: Insufficient documentation

## 2012-07-24 DIAGNOSIS — K219 Gastro-esophageal reflux disease without esophagitis: Secondary | ICD-10-CM | POA: Insufficient documentation

## 2012-07-24 NOTE — ED Notes (Signed)
EMS reports pt resident of [address].   Staff told EMS pt had fallen and staff thinks may have hit head.  Pt c/o lower back pain.  Pt denies any headache but c/o lower back pain.  Pt says doesn't remember falling.  Pt says she just woke up and was in the hospital.  Pt alert but has dementia.

## 2012-07-24 NOTE — ED Provider Notes (Signed)
History    This chart was scribed for Shelda Jakes, MD by Sofie Rower, ED Scribe. The patient was seen in room APA14/APA14 and the patient's care was started at 3:16PM.  LEVEL 5 Caveat: Dementia  CSN: [id number]  Arrival date & time 07/24/12  1457   First MD Initiated Contact with Patient 07/24/12 1516      Chief Complaint  Patient presents with  . Fall  . Back Pain    (Consider location/radiation/quality/duration/timing/severity/associated sxs/prior treatment) Patient is a 79 y.o. female presenting with fall. The history is provided by a relative and the EMS personnel. No language interpreter was used.  Fall The accident occurred 1 to 2 hours ago. The fall occurred while standing. She fell from an unknown height. She landed on a hard floor. There was no blood loss. The pain is present in the right wrist (lumbar back, right wrist). The pain is moderate. She was ambulatory at the scene. There was no entrapment after the fall. There was no drug use involved in the accident. There was no alcohol use involved in the accident. She has tried nothing for the symptoms. The treatment provided no relief.    Kathy Avery is a 79 y.o. female , a [place], brought by EMS, with a hx of dementia, bilateral hip replacement, pelvic fracture, hypertension, depression, and anxiety who presents to the Emergency Department complaining of sudden, moderate, fall, onset today (07/24/12).  Associated symptoms include non radiating back pain located at the lumbar region and right wrist pain. The pt's son reports that the pt fell, from the standing position, earlier this afternoon at the [facility]. The pt is currently complaining of lower back and right wrist pain. Furthermore, the pt's son infroms that [place] staff told him that they believe the pt may have hit her head, however, they did not witness the fall. The pt denies headache and remembering any details from the fall episode at the  present pont and time. There has been no reported LOC by the pt's son nor [employer].   The pt does not smoke or drink alcohol.   PCP is Dr. Dwana Melena.    Past Medical History  Diagnosis Date  . Hypertension   . Depression   . Anxiety   . Alzheimer disease   . GERD (gastroesophageal reflux disease)   . Hyperlipidemia   . Pelvic fracture   . Clavicular fracture   . Cancer     colon    Past Surgical History  Procedure Laterality Date  . Fracture surgery      pelvis    No family history on file.  History  Substance Use Topics  . Smoking status: Never Smoker   . Smokeless tobacco: Never Used  . Alcohol Use: No    OB History   Grav Para Term Preterm Abortions TAB SAB Ect Mult Living                  Review of Systems  Unable to perform ROS: Dementia    Allergies  Morphine and related  Home Medications   Current Outpatient Rx  Name  Route  Sig  Dispense  Refill  . aliskiren (TEKTURNA) 300 MG tablet   Oral   Take 300 mg by mouth daily.         . Calcium Carbonate-Vitamin D (CALCIUM 600/VITAMIN D PO)   Oral   Take 1 tablet by mouth daily.           Marland Kitchen  Cholecalciferol (VITAMIN D) 1000 UNITS capsule   Oral   Take 1,000 Units by mouth daily.           . citalopram (CELEXA) 40 MG tablet   Oral   Take 40 mg by mouth daily.          Marland Kitchen HYDROcodone-acetaminophen (NORCO) 5-325 MG per tablet   Oral   Take 1 tablet by mouth 2 (two) times daily. Given at 9am and 4pm         . hydrOXYzine (ATARAX/VISTARIL) 25 MG tablet   Oral   Take 25 mg by mouth daily.         . meloxicam (MOBIC) 7.5 MG tablet   Oral   Take 7.5 mg by mouth daily.         . memantine (NAMENDA) 5 MG tablet   Oral   Take 5 mg by mouth 2 (two) times daily.           Marland Kitchen omeprazole (PRILOSEC) 20 MG capsule   Oral   Take 20 mg by mouth every morning.          . polyethylene glycol (MIRALAX / GLYCOLAX) packet   Oral   Take 17 g by mouth daily.           .  ranitidine (ZANTAC) 150 MG capsule   Oral   Take 150 mg by mouth every evening.           . rivastigmine (EXELON) 9.5 mg/24hr   Transdermal   Place 1 patch onto the skin daily.           . rosuvastatin (CRESTOR) 10 MG tablet   Oral   Take 10 mg by mouth daily.           . vitamin C (ASCORBIC ACID) 500 MG tablet   Oral   Take 1,000 mg by mouth daily.          Marland Kitchen menthol-cetylpyridinium (CEPACOL) 3 MG lozenge   Oral   Take 1 lozenge by mouth every 6 (six) hours as needed. For sore throat         . ondansetron (ZOFRAN-ODT) 8 MG disintegrating tablet   Oral   Take 8 mg by mouth every 8 (eight) hours as needed. nausea         . promethazine (PHENERGAN) 12.5 MG tablet   Oral   Take 12.5 mg by mouth every 6 (six) hours as needed. For nausea           BP 155/65  Pulse 65  Temp(Src) 97.4 F (36.3 C) (Oral)  Resp 18  SpO2 98%  Physical Exam  Nursing note and vitals reviewed. Constitutional: She is oriented to person, place, and time. She appears well-developed and well-nourished. No distress.  HENT:  Head: Normocephalic and atraumatic.  Mouth/Throat: Oropharynx is clear and moist.  Eyes: EOM are normal. Pupils are equal, round, and reactive to light.  Neck: Neck supple. No tracheal deviation present.  Cardiovascular: Normal rate, regular rhythm and normal heart sounds.   No murmur heard. Pulmonary/Chest: Effort normal and breath sounds normal. No respiratory distress. She has no wheezes.  Abdominal: Soft. Bowel sounds are normal. She exhibits no distension. There is no tenderness.  Musculoskeletal: Normal range of motion. She exhibits no edema.  Lower extremities propped up on exam.   Lymphadenopathy:    She has no cervical adenopathy.  Neurological: She is alert and oriented to person, place, and time. No cranial nerve deficit or sensory  deficit.  Facial muscles working well upon exam.   Skin: Skin is warm and dry.  Capillary refill less than 1 sec.    Psychiatric: She has a normal mood and affect. Her behavior is normal.    ED Course  Procedures (including critical care time)  DIAGNOSTIC STUDIES: Oxygen Saturation is 98% on room air, normal by my interpretation.    COORDINATION OF CARE:   3:39 PM- Treatment plan concerning CT scan discussed with patient's son. Pt's son agrees with treatment.  5:40PM- Recheck. Treatment plan concerning radiology results discussed with pt's son. Pt's son agrees with treatment.      Labs Reviewed - No data to display  Dg Hip Bilateral W/pelvis  07/24/2012  *RADIOLOGY REPORT*  Clinical Data: Fall, low back/pelvic pain  BILATERAL HIP WITH PELVIS - 4+ VIEW  Comparison: 06/05/2012  Findings: No fracture or dislocation is seen.  Bilateral total hip arthroplasty without evidence of hardware complication.  The visualized bony pelvis appears intact.  IMPRESSION: No fracture or dislocation is seen.  Bilateral total hip arthroplasty.   Original Report Authenticated By: Charline Bills, M.D.    Ct Head Wo Contrast  07/24/2012  *RADIOLOGY REPORT*  Clinical Data: Fall  CT HEAD WITHOUT CONTRAST  Technique:  Contiguous axial images were obtained from the base of the skull through the vertex without contrast.  Comparison: 06/05/2012  Findings: No evidence of parenchymal hemorrhage or extra-axial fluid collection. No mass lesion, mass effect, or midline shift.  No CT evidence of acute infarction.  Old left basal ganglia lacunar infarct (series 2/image 11).  Subcortical white matter and periventricular small vessel ischemic changes.  Intracranial atherosclerosis.  Global cortical atrophy.  Stable secondary ventricular prominence.  The visualized paranasal sinuses are essentially clear. The mastoid air cells are unopacified.  No evidence of calvarial fracture.  IMPRESSION: No evidence of acute intracranial abnormality.  Atrophy with small vessel ischemic changes and intracranial atherosclerosis.   Original Report Authenticated  By: Charline Bills, M.D.    Ct Lumbar Spine Wo Contrast  07/24/2012  *RADIOLOGY REPORT*  Clinical Data: Status post fall.  Low back pain.  CT LUMBAR SPINE WITHOUT CONTRAST  Technique:  Multidetector CT imaging of the lumbar spine was performed without intravenous contrast administration. Multiplanar CT image reconstructions were also generated.  Comparison: CT abdomen and pelvis 02/28/2011.  Findings: Vertebral body height and alignment are maintained in the lumbar spine.  Old bilateral sacral ala fractures are identified as seen on the prior study.  Loss of disc space height and vacuum disc phenomenon are seen at T12-L1 and L5-S1.  Multilevel facet degenerative change is present. Ligamentum flavum thickening and disc bulging at L3-4 L4-5 are identified and cause moderate central canal stenosis.  Foramina are mildly narrowed. There is convex right scoliosis.  IMPRESSION:  1.  No acute finding. 2.  Old sacral ala fractures. 3.  Degenerative change of the lumbar spine as above.   Original Report Authenticated By: Holley Dexter, M.D.       1. Fall, initial encounter   2. Back pain       MDM  Workup for the fall negative. No evidence of any hip fractures by x-ray lumbar CT no evidence of any bony injuries to the lumbar area. Head CT was negative. Patient most likely has soft tissue injury to the buttocks and low back area. No specific treatment required. Patient may return to ED as needed. Patient is cleared to return back to the nursing facility.  I personally performed the services described in this documentation, which was scribed in my presence. The recorded information has been reviewed and is accurate.     Shelda Jakes, MD 07/24/12 [phone]

## 2012-08-14 ENCOUNTER — Emergency Department (HOSPITAL_COMMUNITY): Payer: Medicare Other

## 2012-08-14 ENCOUNTER — Emergency Department (HOSPITAL_COMMUNITY)
Admission: EM | Admit: 2012-08-14 | Discharge: 2012-08-14 | Disposition: A | Discharge status: Home / Self Care | Payer: Medicare Other | Attending: Emergency Medicine | Admitting: Emergency Medicine

## 2012-08-14 ENCOUNTER — Encounter (HOSPITAL_COMMUNITY): Payer: Self-pay | Admitting: Emergency Medicine

## 2012-08-14 DIAGNOSIS — IMO0002 Reserved for concepts with insufficient information to code with codable children: Secondary | ICD-10-CM | POA: Insufficient documentation

## 2012-08-14 DIAGNOSIS — F3289 Other specified depressive episodes: Secondary | ICD-10-CM | POA: Insufficient documentation

## 2012-08-14 DIAGNOSIS — K219 Gastro-esophageal reflux disease without esophagitis: Secondary | ICD-10-CM | POA: Insufficient documentation

## 2012-08-14 DIAGNOSIS — E785 Hyperlipidemia, unspecified: Secondary | ICD-10-CM | POA: Insufficient documentation

## 2012-08-14 DIAGNOSIS — R451 Restlessness and agitation: Secondary | ICD-10-CM

## 2012-08-14 DIAGNOSIS — I10 Essential (primary) hypertension: Secondary | ICD-10-CM | POA: Insufficient documentation

## 2012-08-14 DIAGNOSIS — F02818 Dementia in other diseases classified elsewhere, unspecified severity, with other behavioral disturbance: Secondary | ICD-10-CM | POA: Insufficient documentation

## 2012-08-14 DIAGNOSIS — Z85038 Personal history of other malignant neoplasm of large intestine: Secondary | ICD-10-CM | POA: Insufficient documentation

## 2012-08-14 DIAGNOSIS — Z8781 Personal history of (healed) traumatic fracture: Secondary | ICD-10-CM | POA: Insufficient documentation

## 2012-08-14 DIAGNOSIS — F0281 Dementia in other diseases classified elsewhere with behavioral disturbance: Secondary | ICD-10-CM | POA: Insufficient documentation

## 2012-08-14 DIAGNOSIS — Z79899 Other long term (current) drug therapy: Secondary | ICD-10-CM | POA: Insufficient documentation

## 2012-08-14 DIAGNOSIS — F329 Major depressive disorder, single episode, unspecified: Secondary | ICD-10-CM | POA: Insufficient documentation

## 2012-08-14 DIAGNOSIS — F0280 Dementia in other diseases classified elsewhere without behavioral disturbance: Secondary | ICD-10-CM | POA: Insufficient documentation

## 2012-08-14 DIAGNOSIS — G309 Alzheimer's disease, unspecified: Secondary | ICD-10-CM | POA: Insufficient documentation

## 2012-08-14 LAB — URINALYSIS, ROUTINE W REFLEX MICROSCOPIC
Bilirubin Urine: NEGATIVE
Leukocytes, UA: NEGATIVE
Nitrite: NEGATIVE
Specific Gravity, Urine: 1.01 (ref 1.005–1.030)
Urobilinogen, UA: 0.2 mg/dL (ref 0.0–1.0)
pH: 7.5 (ref 5.0–8.0)

## 2012-08-14 NOTE — ED Provider Notes (Signed)
History  This chart was scribed for Donnetta Hutching, MD, by Candelaria Stagers, ED Scribe. This patient was seen in room APA14/APA14 and the patient's care was started at 7:56 AM   CSN: [id number]  Arrival date & time 08/14/12  4098   None     Chief Complaint  Patient presents with  . Agitation   LEVEL 5 CAVEAT due to dementia  The history is provided by the EMS personnel and the nursing home. The history is limited by the condition of the patient. No language interpreter was used.   Kathy Avery is a 79 y.o. female who presents to the Emergency Department via EMS from [facility] after the nursing home reports she became agitated and more confused than normal this morning.  Pt states she feels confused and is experiencing SOB.      Past Medical History  Diagnosis Date  . Hypertension   . Depression   . Anxiety   . Alzheimer disease   . GERD (gastroesophageal reflux disease)   . Hyperlipidemia   . Pelvic fracture   . Clavicular fracture   . Cancer     colon    Past Surgical History  Procedure Laterality Date  . Fracture surgery      pelvis    History reviewed. No pertinent family history.  History  Substance Use Topics  . Smoking status: Never Smoker   . Smokeless tobacco: Never Used  . Alcohol Use: No    OB History   Grav Para Term Preterm Abortions TAB SAB Ect Mult Living                  Review of Systems  Unable to perform ROS: Dementia    Allergies  Morphine and related  Home Medications   Current Outpatient Rx  Name  Route  Sig  Dispense  Refill  . aliskiren (TEKTURNA) 300 MG tablet   Oral   Take 300 mg by mouth daily.         . Calcium Carbonate-Vitamin D (CALCIUM 600/VITAMIN D PO)   Oral   Take 1 tablet by mouth daily.           . Cholecalciferol (VITAMIN D) 1000 UNITS capsule   Oral   Take 1,000 Units by mouth daily.           . citalopram (CELEXA) 40 MG tablet   Oral   Take 40 mg by mouth daily.          Marland Kitchen  HYDROcodone-acetaminophen (NORCO) 5-325 MG per tablet   Oral   Take 1 tablet by mouth 2 (two) times daily. Given at 9am and 4pm         . hydrOXYzine (ATARAX/VISTARIL) 25 MG tablet   Oral   Take 25 mg by mouth daily.         . meloxicam (MOBIC) 7.5 MG tablet   Oral   Take 7.5 mg by mouth daily.         . memantine (NAMENDA) 5 MG tablet   Oral   Take 5 mg by mouth 2 (two) times daily.           Marland Kitchen menthol-cetylpyridinium (CEPACOL) 3 MG lozenge   Oral   Take 1 lozenge by mouth every 6 (six) hours as needed. For sore throat         . omeprazole (PRILOSEC) 20 MG capsule   Oral   Take 20 mg by mouth every morning.          Marland Kitchen  ondansetron (ZOFRAN-ODT) 8 MG disintegrating tablet   Oral   Take 8 mg by mouth every 8 (eight) hours as needed. nausea         . polyethylene glycol (MIRALAX / GLYCOLAX) packet   Oral   Take 17 g by mouth daily.           . promethazine (PHENERGAN) 12.5 MG tablet   Oral   Take 12.5 mg by mouth every 6 (six) hours as needed. For nausea         . ranitidine (ZANTAC) 150 MG capsule   Oral   Take 150 mg by mouth every evening.           . rivastigmine (EXELON) 9.5 mg/24hr   Transdermal   Place 1 patch onto the skin daily.           . rosuvastatin (CRESTOR) 10 MG tablet   Oral   Take 10 mg by mouth daily.           . vitamin C (ASCORBIC ACID) 500 MG tablet   Oral   Take 1,000 mg by mouth daily.            BP 178/77  Pulse 73  Temp(Src) 97.8 F (36.6 C) (Oral)  Resp 16  SpO2 99%  Physical Exam  Nursing note and vitals reviewed. Constitutional: She appears well-developed and well-nourished. No distress.  HENT:  Head: Normocephalic and atraumatic.  Eyes: EOM are normal. Pupils are equal, round, and reactive to light.  Neck: Neck supple. No tracheal deviation present.  Cardiovascular: Normal rate and regular rhythm.   Pulmonary/Chest: Effort normal and breath sounds normal. No respiratory distress. She has no  wheezes.  Abdominal: Soft. She exhibits no distension.  Musculoskeletal: Normal range of motion. She exhibits edema (2+ peripheral edema to lower extremities).  Neurological: She is alert. No sensory deficit.  Oriented to person only.   Skin: Skin is warm and dry.    ED Course  Procedures   DIAGNOSTIC STUDIES: Oxygen Saturation is 99% on room air, normal by my interpretation.    COORDINATION OF CARE:  8:01 AM Will order EKG  Labs Reviewed  URINALYSIS, ROUTINE W REFLEX MICROSCOPIC   Dg Chest Portable 1 View  08/14/2012  *RADIOLOGY REPORT*  Clinical Data: Agitation.  PORTABLE CHEST - 1 VIEW  Comparison: 05/13/2012  Findings: Single view of the chest was obtained.  There are coarse peribronchial markings in the right lower lung which are chronic. The thoracic aorta has atherosclerotic calcifications.  There is no focal airspace disease.  Stable appearance of the heart.  The patient has an old left clavicle fracture.  IMPRESSION: No acute chest findings.   Original Report Authenticated By: Richarda Overlie, M.D.      No diagnosis found.   Date: 08/14/2012  Rate: 66  Rhythm: normal sinus rhythm  QRS Axis: normal  Intervals: normal  ST/T Wave abnormalities: normal  Conduction Disutrbances: none  Narrative Interpretation: unremarkable     MDM  Patient is in no acute distress. Son states she has normal.  Chest x-ray, EKG, urinalysis normal I personally performed the services described in this documentation, which was scribed in my presence. The recorded information has been reviewed and is accurate.          Donnetta Hutching, MD 08/14/12 1018

## 2012-08-14 NOTE — ED Notes (Signed)
Pt from Al[facility] unit of [facility]. Pt woke up very agitated this am and more confused than usual. cbg 128 in route. Pt came in stating "why am I here, what is wrong with me, why cant i breath". No sob observed. Pt breaths fast at times. Confusion obvious. Knows name and DOB.

## 2012-08-23 ENCOUNTER — Emergency Department (HOSPITAL_COMMUNITY): Payer: Medicare Other

## 2012-08-23 ENCOUNTER — Encounter (HOSPITAL_COMMUNITY): Payer: Self-pay | Admitting: *Deleted

## 2012-08-23 ENCOUNTER — Emergency Department (HOSPITAL_COMMUNITY)
Admission: EM | Admit: 2012-08-23 | Discharge: 2012-08-23 | Disposition: A | Discharge status: Home / Self Care | Payer: Medicare Other | Attending: Emergency Medicine | Admitting: Emergency Medicine

## 2012-08-23 DIAGNOSIS — R0602 Shortness of breath: Secondary | ICD-10-CM | POA: Insufficient documentation

## 2012-08-23 DIAGNOSIS — G309 Alzheimer's disease, unspecified: Secondary | ICD-10-CM | POA: Insufficient documentation

## 2012-08-23 DIAGNOSIS — Z85038 Personal history of other malignant neoplasm of large intestine: Secondary | ICD-10-CM | POA: Insufficient documentation

## 2012-08-23 DIAGNOSIS — F3289 Other specified depressive episodes: Secondary | ICD-10-CM | POA: Insufficient documentation

## 2012-08-23 DIAGNOSIS — F411 Generalized anxiety disorder: Secondary | ICD-10-CM | POA: Insufficient documentation

## 2012-08-23 DIAGNOSIS — Z79899 Other long term (current) drug therapy: Secondary | ICD-10-CM | POA: Insufficient documentation

## 2012-08-23 DIAGNOSIS — F329 Major depressive disorder, single episode, unspecified: Secondary | ICD-10-CM | POA: Insufficient documentation

## 2012-08-23 DIAGNOSIS — E785 Hyperlipidemia, unspecified: Secondary | ICD-10-CM | POA: Insufficient documentation

## 2012-08-23 DIAGNOSIS — I10 Essential (primary) hypertension: Secondary | ICD-10-CM | POA: Insufficient documentation

## 2012-08-23 DIAGNOSIS — K219 Gastro-esophageal reflux disease without esophagitis: Secondary | ICD-10-CM | POA: Insufficient documentation

## 2012-08-23 DIAGNOSIS — F0280 Dementia in other diseases classified elsewhere without behavioral disturbance: Secondary | ICD-10-CM | POA: Insufficient documentation

## 2012-08-23 DIAGNOSIS — Z8781 Personal history of (healed) traumatic fracture: Secondary | ICD-10-CM | POA: Insufficient documentation

## 2012-08-23 NOTE — ED Provider Notes (Addendum)
History     CSN: [id number]  Arrival date & time 08/23/12  4098   First MD Initiated Contact with Patient 08/23/12 0350      Chief Complaint  Patient presents with  . Shortness of Breath    (Consider location/radiation/quality/duration/timing/severity/associated sxs/prior treatment) HPI Level 5 caveat due to dementia Kathy Avery is a 79 y.o. female brought in by ambulance, who presents to the Emergency Department complaining of shortness of breath. Per EMS staff patient c/o SOB. When they got to facility patient was sitting , O2 sats 95%, lungs clear.    PCP Dr. Margo Aye Past Medical History  Diagnosis Date  . Hypertension   . Depression   . Anxiety   . Alzheimer disease   . GERD (gastroesophageal reflux disease)   . Hyperlipidemia   . Pelvic fracture   . Clavicular fracture   . Cancer     colon    Past Surgical History  Procedure Laterality Date  . Fracture surgery      pelvis    History reviewed. No pertinent family history.  History  Substance Use Topics  . Smoking status: Never Smoker   . Smokeless tobacco: Never Used  . Alcohol Use: No    OB History   Grav Para Term Preterm Abortions TAB SAB Ect Mult Living                  Review of Systems  Constitutional: Negative for fever.       10 Systems reviewed and are negative for acute change except as noted in the HPI.  HENT: Negative for congestion.   Eyes: Negative for discharge and redness.  Respiratory: Positive for shortness of breath. Negative for cough.   Cardiovascular: Negative for chest pain.  Gastrointestinal: Negative for vomiting and abdominal pain.  Musculoskeletal: Negative for back pain.  Skin: Negative for rash.  Neurological: Negative for syncope, numbness and headaches.  Psychiatric/Behavioral:       No behavior change.    Allergies  Morphine and related  Home Medications   Current Outpatient Rx  Name  Route  Sig  Dispense  Refill  . aliskiren (TEKTURNA) 300 MG tablet  Oral   Take 300 mg by mouth daily.         . Calcium Carbonate-Vitamin D (CALCIUM 600/VITAMIN D PO)   Oral   Take 1 tablet by mouth daily.           . Cholecalciferol (VITAMIN D) 1000 UNITS capsule   Oral   Take 1,000 Units by mouth daily.           . citalopram (CELEXA) 40 MG tablet   Oral   Take 40 mg by mouth daily.          Marland Kitchen HYDROcodone-acetaminophen (NORCO) 5-325 MG per tablet   Oral   Take 1 tablet by mouth 2 (two) times daily. Given at 9am and 4pm         . hydrOXYzine (ATARAX/VISTARIL) 25 MG tablet   Oral   Take 25 mg by mouth daily.         . meloxicam (MOBIC) 7.5 MG tablet   Oral   Take 7.5 mg by mouth daily.         . memantine (NAMENDA) 5 MG tablet   Oral   Take 5 mg by mouth 2 (two) times daily.           Marland Kitchen menthol-cetylpyridinium (CEPACOL) 3 MG lozenge   Oral   Take  1 lozenge by mouth every 6 (six) hours as needed. For sore throat         . omeprazole (PRILOSEC) 20 MG capsule   Oral   Take 20 mg by mouth every morning.          . ondansetron (ZOFRAN-ODT) 8 MG disintegrating tablet   Oral   Take 8 mg by mouth every 8 (eight) hours as needed. nausea         . polyethylene glycol (MIRALAX / GLYCOLAX) packet   Oral   Take 17 g by mouth daily.           . promethazine (PHENERGAN) 12.5 MG tablet   Oral   Take 12.5 mg by mouth every 6 (six) hours as needed. For nausea         . ranitidine (ZANTAC) 150 MG capsule   Oral   Take 150 mg by mouth every evening.           . rivastigmine (EXELON) 9.5 mg/24hr   Transdermal   Place 1 patch onto the skin daily.           . rosuvastatin (CRESTOR) 10 MG tablet   Oral   Take 10 mg by mouth daily.           . vitamin C (ASCORBIC ACID) 500 MG tablet   Oral   Take 1,000 mg by mouth daily.            BP 134/62  Pulse 67  Temp(Src) 97.8 F (36.6 C) (Oral)  Resp 13  SpO2 96%  Physical Exam  Nursing note and vitals reviewed. Constitutional: She appears well-developed  and well-nourished.  Awake, alert, nontoxic appearance.  HENT:  Head: Normocephalic and atraumatic.  HOH  Eyes: EOM are normal. Pupils are equal, round, and reactive to light.  Neck: Neck supple.  Cardiovascular: Normal rate and intact distal pulses.   Pulmonary/Chest: Effort normal and breath sounds normal. She has no wheezes. She has no rales. She exhibits no tenderness.  Abdominal: Soft. Bowel sounds are normal. There is no tenderness. There is no rebound.  Musculoskeletal: She exhibits no tenderness.  Baseline ROM, no obvious new focal weakness.  Neurological:  Mental status and motor strength appears baseline for patient and situation.  Skin: No rash noted.  Psychiatric: She has a normal mood and affect.    ED Course  Procedures (including critical care time)  Dg Chest [facility] 1 View  08/23/2012  *RADIOLOGY REPORT*  Clinical Data: Shortness of breath.  PORTABLE CHEST - 1 VIEW  Comparison: Chest radiograph performed 08/14/2012  Findings: The lungs are well-aerated.  There is elevation of the right hemidiaphragm. Minimal bibasilar opacities likely reflect atelectasis, though mild pneumonia cannot be excluded.  There is no evidence of pleural effusion or pneumothorax.  The cardiomediastinal silhouette is normal in size; calcification is noted within the aortic arch.  No acute osseous abnormalities are seen.  IMPRESSION: Elevation of the right hemidiaphragm.  Minimal bibasilar opacities likely reflect atelectasis, though mild pneumonia cannot be excluded.   Original Report Authenticated By: Tonia Ghent, M.D.      MDM  Patient in SNF here with c/o SOB without symptoms. She has Alzheimer's and is unable to give a history. PE is normal. Chest xray is normal. O2 sats 96%. Respirations 13. Pt stable in ED with no significant deterioration in condition.The patient appears reasonably screened and/or stabilized for discharge and I doubt any other medical condition or other [facility] requiring further  screening,  evaluation, or treatment in the ED at this time prior to discharge.  MDM Reviewed: nursing note and vitals Interpretation: x-ray           Nicoletta Dress. Colon Branch, MD 08/23/12 1478  Nicoletta Dress. Colon Branch, MD 08/23/12 [phone]

## 2012-08-23 NOTE — ED Notes (Signed)
Discharge instructions reviewed with pt, questions answered. Pt verbalized understanding.  

## 2012-08-23 NOTE — ED Notes (Signed)
Report called to Musician at [facility].  Transportation will be arranged by [employer].

## 2012-08-23 NOTE — ED Notes (Signed)
Pt is a resident at [facility] Alzheimer unit, pt co SOB with tingling in hands and feet. Pt room air sats 95%, all lung fields clear on auscultation. Pt able to speak with no difficulty.

## 2012-08-23 NOTE — ED Notes (Signed)
Transporter arrived, pt placed in vehicle with no problems.

## 2012-09-18 ENCOUNTER — Encounter (HOSPITAL_COMMUNITY): Payer: Self-pay | Admitting: *Deleted

## 2012-09-18 ENCOUNTER — Emergency Department (HOSPITAL_COMMUNITY)
Admission: EM | Admit: 2012-09-18 | Discharge: 2012-09-18 | Disposition: A | Discharge status: Home / Self Care | Payer: Medicare Other | Attending: Emergency Medicine | Admitting: Emergency Medicine

## 2012-09-18 DIAGNOSIS — I10 Essential (primary) hypertension: Secondary | ICD-10-CM | POA: Insufficient documentation

## 2012-09-18 DIAGNOSIS — F411 Generalized anxiety disorder: Secondary | ICD-10-CM | POA: Insufficient documentation

## 2012-09-18 DIAGNOSIS — Y9210 Unspecified residential institution as the place of occurrence of the external cause: Secondary | ICD-10-CM | POA: Insufficient documentation

## 2012-09-18 DIAGNOSIS — E785 Hyperlipidemia, unspecified: Secondary | ICD-10-CM | POA: Insufficient documentation

## 2012-09-18 DIAGNOSIS — W19XXXA Unspecified fall, initial encounter: Secondary | ICD-10-CM

## 2012-09-18 DIAGNOSIS — K219 Gastro-esophageal reflux disease without esophagitis: Secondary | ICD-10-CM | POA: Insufficient documentation

## 2012-09-18 DIAGNOSIS — R296 Repeated falls: Secondary | ICD-10-CM | POA: Insufficient documentation

## 2012-09-18 DIAGNOSIS — Z85038 Personal history of other malignant neoplasm of large intestine: Secondary | ICD-10-CM | POA: Insufficient documentation

## 2012-09-18 DIAGNOSIS — F3289 Other specified depressive episodes: Secondary | ICD-10-CM | POA: Insufficient documentation

## 2012-09-18 DIAGNOSIS — G309 Alzheimer's disease, unspecified: Secondary | ICD-10-CM | POA: Insufficient documentation

## 2012-09-18 DIAGNOSIS — Z79899 Other long term (current) drug therapy: Secondary | ICD-10-CM | POA: Insufficient documentation

## 2012-09-18 DIAGNOSIS — Y9389 Activity, other specified: Secondary | ICD-10-CM | POA: Insufficient documentation

## 2012-09-18 DIAGNOSIS — Z043 Encounter for examination and observation following other accident: Secondary | ICD-10-CM | POA: Insufficient documentation

## 2012-09-18 DIAGNOSIS — F0280 Dementia in other diseases classified elsewhere without behavioral disturbance: Secondary | ICD-10-CM | POA: Insufficient documentation

## 2012-09-18 DIAGNOSIS — Z8781 Personal history of (healed) traumatic fracture: Secondary | ICD-10-CM | POA: Insufficient documentation

## 2012-09-18 DIAGNOSIS — F329 Major depressive disorder, single episode, unspecified: Secondary | ICD-10-CM | POA: Insufficient documentation

## 2012-09-18 NOTE — ED Provider Notes (Signed)
History     CSN: [id number]  Arrival date & time 09/18/12  6213   First MD Initiated Contact with Patient 09/18/12 [phone]      Chief Complaint  Patient presents with  . Fall    (Consider location/radiation/quality/duration/timing/severity/associated sxs/prior treatment) HPI  Kathy Avery is a 79 y.o. female resident of [address] brought in by ambulance, who presents to the Emergency Department having been found on the floor in the bathroom. She had gotten up to the bathroom without using her wheelchair.She has no c/o. EMS was called to transport her to the ER.  PCP Dr. Margo Aye   Past Medical History  Diagnosis Date  . Hypertension   . Depression   . Anxiety   . Alzheimer disease   . GERD (gastroesophageal reflux disease)   . Hyperlipidemia   . Pelvic fracture   . Clavicular fracture   . Cancer     colon    Past Surgical History  Procedure Laterality Date  . Fracture surgery      pelvis    No family history on file.  History  Substance Use Topics  . Smoking status: Never Smoker   . Smokeless tobacco: Never Used  . Alcohol Use: No    OB History   Grav Para Term Preterm Abortions TAB SAB Ect Mult Living                  Review of Systems  Constitutional: Negative for fever.       10 Systems reviewed and are negative for acute change except as noted in the HPI.  HENT: Negative for congestion.   Eyes: Negative for discharge and redness.  Respiratory: Negative for cough and shortness of breath.   Cardiovascular: Negative for chest pain.  Gastrointestinal: Negative for vomiting and abdominal pain.  Musculoskeletal: Negative for back pain.  Skin: Negative for rash.  Neurological: Negative for syncope, numbness and headaches.  Psychiatric/Behavioral:       No behavior change.    Allergies  Morphine and related  Home Medications   Current Outpatient Rx  Name  Route  Sig  Dispense  Refill  . aliskiren (TEKTURNA) 300 MG tablet   Oral   Take 300 mg by  mouth daily.         . Calcium Carbonate-Vitamin D (CALCIUM 600/VITAMIN D PO)   Oral   Take 1 tablet by mouth daily.           . Cholecalciferol (VITAMIN D) 1000 UNITS capsule   Oral   Take 1,000 Units by mouth daily.           . citalopram (CELEXA) 40 MG tablet   Oral   Take 40 mg by mouth daily.          . clonazePAM (KLONOPIN) 0.5 MG tablet   Oral   Take 0.5 mg by mouth at bedtime.         Marland Kitchen HYDROcodone-acetaminophen (NORCO) 5-325 MG per tablet   Oral   Take 1 tablet by mouth 2 (two) times daily. Given at 9am and 4pm         . hydrOXYzine (ATARAX/VISTARIL) 25 MG tablet   Oral   Take 25 mg by mouth daily.         . meloxicam (MOBIC) 7.5 MG tablet   Oral   Take 7.5 mg by mouth daily.         . memantine (NAMENDA) 5 MG tablet   Oral   Take  5 mg by mouth 2 (two) times daily.           Marland Kitchen menthol-cetylpyridinium (CEPACOL) 3 MG lozenge   Oral   Take 1 lozenge by mouth every 6 (six) hours as needed. For sore throat         . omeprazole (PRILOSEC) 20 MG capsule   Oral   Take 20 mg by mouth every morning.          . ondansetron (ZOFRAN-ODT) 8 MG disintegrating tablet   Oral   Take 8 mg by mouth every 8 (eight) hours as needed. nausea         . polyethylene glycol (MIRALAX / GLYCOLAX) packet   Oral   Take 17 g by mouth daily.           . promethazine (PHENERGAN) 12.5 MG tablet   Oral   Take 12.5 mg by mouth every 6 (six) hours as needed. For nausea         . ranitidine (ZANTAC) 150 MG capsule   Oral   Take 150 mg by mouth every evening.           . rivastigmine (EXELON) 9.5 mg/24hr   Transdermal   Place 1 patch onto the skin daily.           . rosuvastatin (CRESTOR) 10 MG tablet   Oral   Take 10 mg by mouth daily.           . vitamin C (ASCORBIC ACID) 500 MG tablet   Oral   Take 1,000 mg by mouth daily.            BP 169/91  Pulse 78  Temp(Src) 97.6 F (36.4 C) (Oral)  Resp 20  Ht 5\' 9"  (1.753 m)  Wt 140 lb  (63.504 kg)  BMI 20.67 kg/m2  SpO2 99%  Physical Exam  Nursing note and vitals reviewed. Constitutional: She appears well-developed and well-nourished.  Awake, alert, nontoxic appearance.  HENT:  Head: Normocephalic and atraumatic.  Right Ear: External ear normal.  Left Ear: External ear normal.  Mouth/Throat: Oropharynx is clear and moist.  Eyes: EOM are normal. Pupils are equal, round, and reactive to light.  Neck: Normal range of motion. Neck supple.  Cardiovascular: Normal rate and intact distal pulses.   Pulmonary/Chest: Effort normal and breath sounds normal. She exhibits no tenderness.  Abdominal: Soft. Bowel sounds are normal. There is no tenderness. There is no rebound.  Musculoskeletal: Normal range of motion. She exhibits no tenderness.  Baseline ROM, no obvious new focal weakness.  Neurological:  Mental status and motor strength appears baseline for patient and situation.  Skin: No rash noted.  Psychiatric: She has a normal mood and affect.    ED Course  Procedures (including critical care time)     MDM  Patient, resident of [facility] brought in after falling in the bathroom. PE is normal. No deficits, bruises, lesions. Will return patient to the [facility].Pt stable in ED with no significant deterioration in condition.The patient appears reasonably screened and/or stabilized for discharge and I doubt any other medical condition or other [facility] requiring further screening, evaluation, or treatment in the ED at this time prior to discharge.  MDM Reviewed: nursing note and vitals           Nicoletta Dress. Colon Branch, MD 09/18/12 [phone]

## 2012-09-18 NOTE — ED Notes (Signed)
Report called back to [facility] & family carried pt back to facility.

## 2012-09-18 NOTE — ED Notes (Signed)
Report called back to [facility]. Family member carried pt  to facility.

## 2012-09-18 NOTE — ED Notes (Signed)
Pt from [facility], arrived by EMS. Was reported pt did not use wheelchair tonight when using the restroom & was found in the floor. Pt denies hurting anywhere at this time. Pt on LSB w/ c collar.

## 2012-09-18 NOTE — ED Notes (Signed)
Reported pt found in floor by staff. Pt had not used wheelchair to go to the restroom. Pt removed from backboard as per protocol. Pt remains w/ c collar on.

## 2013-03-02 ENCOUNTER — Encounter (HOSPITAL_COMMUNITY): Payer: Self-pay | Admitting: Emergency Medicine

## 2013-03-02 ENCOUNTER — Emergency Department (HOSPITAL_COMMUNITY)
Admission: EM | Admit: 2013-03-02 | Discharge: 2013-03-02 | Disposition: A | Discharge status: Home / Self Care | Payer: Medicare Other | Attending: Emergency Medicine | Admitting: Emergency Medicine

## 2013-03-02 ENCOUNTER — Emergency Department (HOSPITAL_COMMUNITY): Payer: Medicare Other

## 2013-03-02 DIAGNOSIS — Z8781 Personal history of (healed) traumatic fracture: Secondary | ICD-10-CM | POA: Insufficient documentation

## 2013-03-02 DIAGNOSIS — F0280 Dementia in other diseases classified elsewhere without behavioral disturbance: Secondary | ICD-10-CM | POA: Insufficient documentation

## 2013-03-02 DIAGNOSIS — E785 Hyperlipidemia, unspecified: Secondary | ICD-10-CM | POA: Insufficient documentation

## 2013-03-02 DIAGNOSIS — F3289 Other specified depressive episodes: Secondary | ICD-10-CM | POA: Insufficient documentation

## 2013-03-02 DIAGNOSIS — G309 Alzheimer's disease, unspecified: Secondary | ICD-10-CM | POA: Insufficient documentation

## 2013-03-02 DIAGNOSIS — Z791 Long term (current) use of non-steroidal anti-inflammatories (NSAID): Secondary | ICD-10-CM | POA: Insufficient documentation

## 2013-03-02 DIAGNOSIS — Z79899 Other long term (current) drug therapy: Secondary | ICD-10-CM | POA: Insufficient documentation

## 2013-03-02 DIAGNOSIS — S0083XA Contusion of other part of head, initial encounter: Secondary | ICD-10-CM

## 2013-03-02 DIAGNOSIS — W19XXXA Unspecified fall, initial encounter: Secondary | ICD-10-CM

## 2013-03-02 DIAGNOSIS — Z85038 Personal history of other malignant neoplasm of large intestine: Secondary | ICD-10-CM | POA: Insufficient documentation

## 2013-03-02 DIAGNOSIS — Y9389 Activity, other specified: Secondary | ICD-10-CM | POA: Insufficient documentation

## 2013-03-02 DIAGNOSIS — IMO0002 Reserved for concepts with insufficient information to code with codable children: Secondary | ICD-10-CM | POA: Insufficient documentation

## 2013-03-02 DIAGNOSIS — S0003XA Contusion of scalp, initial encounter: Secondary | ICD-10-CM | POA: Insufficient documentation

## 2013-03-02 DIAGNOSIS — I10 Essential (primary) hypertension: Secondary | ICD-10-CM | POA: Insufficient documentation

## 2013-03-02 DIAGNOSIS — K219 Gastro-esophageal reflux disease without esophagitis: Secondary | ICD-10-CM | POA: Insufficient documentation

## 2013-03-02 DIAGNOSIS — F329 Major depressive disorder, single episode, unspecified: Secondary | ICD-10-CM | POA: Insufficient documentation

## 2013-03-02 DIAGNOSIS — W06XXXA Fall from bed, initial encounter: Secondary | ICD-10-CM | POA: Insufficient documentation

## 2013-03-02 DIAGNOSIS — Y9210 Unspecified residential institution as the place of occurrence of the external cause: Secondary | ICD-10-CM | POA: Insufficient documentation

## 2013-03-02 DIAGNOSIS — F411 Generalized anxiety disorder: Secondary | ICD-10-CM | POA: Insufficient documentation

## 2013-03-02 MED ORDER — ACETAMINOPHEN 325 MG PO TABS
650.0000 mg | ORAL_TABLET | Freq: Once | ORAL | Status: AC
  Administered 2013-03-02: 650 mg via ORAL
  Filled 2013-03-02: qty 2

## 2013-03-02 MED ORDER — BACITRACIN-NEOMYCIN-POLYMYXIN 400-5-5000 EX OINT
TOPICAL_OINTMENT | Freq: Once | CUTANEOUS | Status: AC
  Administered 2013-03-02: 1 via TOPICAL
  Filled 2013-03-02: qty 1

## 2013-03-02 NOTE — ED Provider Notes (Signed)
CSN: [id number]     Arrival date & time 03/02/13  0807 History   First MD Initiated Contact with Patient 03/02/13 [phone]     Chief Complaint  Patient presents with  . Fall  . Abrasion   (Consider location/radiation/quality/duration/timing/severity/associated sxs/prior Treatment) Patient is a 79 y.o. female presenting with fall.  Fall   Kathy Avery is a 79 y.o. female who presents to the ED via EMS. Report from Nursing Home staff that patient rolled out of bed and has an abrasion to her right forehead. Patient with hx of Alzheimer Disease. Staff reports that patient is at her normal baseline. Patient reports that her head is hurting and that she "doesn't feel right".   Past Medical History  Diagnosis Date  . Hypertension   . Depression   . Anxiety   . Alzheimer disease   . GERD (gastroesophageal reflux disease)   . Hyperlipidemia   . Pelvic fracture   . Clavicular fracture   . Cancer     colon   Past Surgical History  Procedure Laterality Date  . Fracture surgery      pelvis   No family history on file. History  Substance Use Topics  . Smoking status: Never Smoker   . Smokeless tobacco: Never Used  . Alcohol Use: No   OB History   Grav Para Term Preterm Abortions TAB SAB Ect Mult Living                 Review of Systems  Unable to perform ROS   Allergies  Morphine and related  Home Medications   Current Outpatient Rx  Name  Route  Sig  Dispense  Refill  . aliskiren (TEKTURNA) 300 MG tablet   Oral   Take 300 mg by mouth daily.         . Calcium Carbonate-Vitamin D (CALCIUM 600/VITAMIN D PO)   Oral   Take 1 tablet by mouth daily.           . Cholecalciferol (VITAMIN D) 1000 UNITS capsule   Oral   Take 1,000 Units by mouth daily.           . citalopram (CELEXA) 40 MG tablet   Oral   Take 40 mg by mouth daily.          . clonazePAM (KLONOPIN) 0.5 MG tablet   Oral   Take 0.5 mg by mouth at bedtime.         Marland Kitchen HYDROcodone-acetaminophen  (NORCO) 5-325 MG per tablet   Oral   Take 1 tablet by mouth 2 (two) times daily. Given at 9am and 4pm         . hydrOXYzine (ATARAX/VISTARIL) 25 MG tablet   Oral   Take 25 mg by mouth daily.         . meloxicam (MOBIC) 7.5 MG tablet   Oral   Take 7.5 mg by mouth daily.         . memantine (NAMENDA) 5 MG tablet   Oral   Take 5 mg by mouth 2 (two) times daily.           Marland Kitchen menthol-cetylpyridinium (CEPACOL) 3 MG lozenge   Oral   Take 1 lozenge by mouth every 6 (six) hours as needed. For sore throat         . omeprazole (PRILOSEC) 20 MG capsule   Oral   Take 20 mg by mouth every morning.          Marland Kitchen  ondansetron (ZOFRAN-ODT) 8 MG disintegrating tablet   Oral   Take 8 mg by mouth every 8 (eight) hours as needed. nausea         . polyethylene glycol (MIRALAX / GLYCOLAX) packet   Oral   Take 17 g by mouth daily.           . promethazine (PHENERGAN) 12.5 MG tablet   Oral   Take 12.5 mg by mouth every 6 (six) hours as needed. For nausea         . ranitidine (ZANTAC) 150 MG capsule   Oral   Take 150 mg by mouth every evening.           . rivastigmine (EXELON) 9.5 mg/24hr   Transdermal   Place 1 patch onto the skin daily.           . rosuvastatin (CRESTOR) 10 MG tablet   Oral   Take 10 mg by mouth daily.           . vitamin C (ASCORBIC ACID) 500 MG tablet   Oral   Take 1,000 mg by mouth daily.           BP 158/72  Pulse 72  Temp(Src) 98.2 F (36.8 C) (Oral)  Resp 16  SpO2 99% Physical Exam  Nursing note and vitals reviewed. Constitutional: She appears well-developed and well-nourished.  HENT:  Head:    Right Ear: Tympanic membrane normal.  Left Ear: Tympanic membrane normal.  Nose: No nasal septal hematoma. No epistaxis.  Abrasion to right forehead and ecchymosis noted right orbit. Tender on palpation. No bleeding.  Eyes: Conjunctivae and EOM are normal. Pupils are equal, round, and reactive to light.  Good visual fields  Neck: Neck  supple.  Cardiovascular: Normal rate and regular rhythm.   Pulmonary/Chest: Effort normal and breath sounds normal.  Abdominal: Soft. There is no tenderness.  Musculoskeletal: Normal range of motion.  Neurological: She is alert. She has normal strength. No cranial nerve deficit.  Skin: Skin is warm and dry.  Psychiatric: Her speech is normal.  Patient with Alzheimer Disease. Continues to ask what happened and why her head hurts   Ct Head Wo Contrast  03/02/2013   CLINICAL DATA:  Fall.  EXAM: CT HEAD WITHOUT CONTRAST  CT CERVICAL SPINE WITHOUT CONTRAST  TECHNIQUE: Multidetector CT imaging of the head and cervical spine was performed following the standard protocol without intravenous contrast. Multiplanar CT image reconstructions of the cervical spine were also generated.  COMPARISON:  07/24/2012 head CT. 06/05/2012 head CT and cervical spine CT.  FINDINGS: CT HEAD FINDINGS  No skull fracture or intracranial hemorrhage.  Global atrophy. Ventricular prominence unchanged and may be related to atrophy although difficult to exclude hydrocephalus.  Small vessel disease type changes without CT evidence of large acute infarct.  Vascular calcifications.  CT CERVICAL SPINE FINDINGS  No cervical spine fracture. Cervical spondylotic changes similar to prior exam.  No abnormal prevertebral soft tissue swelling.  Vascular calcifications.  IMPRESSION: No skull fracture or intracranial hemorrhage.  Atrophy and ventriculomegaly stable.  Small vessel disease type changes.  No cervical spine fracture.   Electronically Signed   By: Bridgett Larsson M.D.   On: 03/02/2013 09:49   Ct Cervical Spine Wo Contrast  03/02/2013   CLINICAL DATA:  Fall.  EXAM: CT HEAD WITHOUT CONTRAST  CT CERVICAL SPINE WITHOUT CONTRAST  TECHNIQUE: Multidetector CT imaging of the head and cervical spine was performed following the standard protocol without intravenous contrast. Multiplanar CT  image reconstructions of the cervical spine were also  generated.  COMPARISON:  07/24/2012 head CT. 06/05/2012 head CT and cervical spine CT.  FINDINGS: CT HEAD FINDINGS  No skull fracture or intracranial hemorrhage.  Global atrophy. Ventricular prominence unchanged and may be related to atrophy although difficult to exclude hydrocephalus.  Small vessel disease type changes without CT evidence of large acute infarct.  Vascular calcifications.  CT CERVICAL SPINE FINDINGS  No cervical spine fracture. Cervical spondylotic changes similar to prior exam.  No abnormal prevertebral soft tissue swelling.  Vascular calcifications.  IMPRESSION: No skull fracture or intracranial hemorrhage.  Atrophy and ventriculomegaly stable.  Small vessel disease type changes.  No cervical spine fracture.   Electronically Signed   By: Bridgett Larsson M.D.   On: 03/02/2013 09:49    ED Course  Procedures After patient returned from CT scan I discussed results with her son. Ambulated patient prior to discharge. Patient's son states she is doing what is her norm. She has chronic knee pain.  MDM  79 y.o. female with abrasion and contusion to right forehead. Will treat with tylenol and she will go back to her care facility. Patient stable for discharge without any immediate complications. Patient's son will return with patient if any problems arise.    Medication List    ASK your doctor about these medications       aliskiren 300 MG tablet  Commonly known as:  TEKTURNA  Take 300 mg by mouth daily.     CALCIUM 600/VITAMIN D PO  Take 1 tablet by mouth daily.     citalopram 40 MG tablet  Commonly known as:  CELEXA  Take 40 mg by mouth daily.     HYDROcodone-acetaminophen 5-325 MG per tablet  Commonly known as:  NORCO/VICODIN  Take 1 tablet by mouth 2 (two) times daily. Given at 9am and 4pm     hydrOXYzine 25 MG capsule  Commonly known as:  VISTARIL  Take 25 mg by mouth at bedtime.     hydrOXYzine 25 MG capsule  Commonly known as:  VISTARIL  Take 25 mg by mouth 2 (two) times  daily as needed for anxiety.     loperamide 2 MG capsule  Commonly known as:  IMODIUM  Take 2 mg by mouth 3 (three) times daily as needed for diarrhea or loose stools.     Melatonin 10 MG Caps  Take 10 mg by mouth at bedtime as needed.     meloxicam 7.5 MG tablet  Commonly known as:  MOBIC  Take 7.5 mg by mouth daily.     NAMENDA XR 28 MG Cp24  Generic drug:  Memantine HCl ER  Take 28 mg by mouth daily.     omeprazole 20 MG capsule  Commonly known as:  PRILOSEC  Take 20 mg by mouth every morning.     ondansetron 8 MG disintegrating tablet  Commonly known as:  ZOFRAN-ODT  Take 8 mg by mouth every 4 (four) hours as needed for nausea. nausea     polyethylene glycol packet  Commonly known as:  MIRALAX / GLYCOLAX  Take 17 g by mouth daily.     ranitidine 150 MG capsule  Commonly known as:  ZANTAC  Take 150 mg by mouth every evening.     rivastigmine 9.5 mg/24hr  Commonly known as:  EXELON  Place 1 patch onto the skin daily.     rosuvastatin 10 MG tablet  Commonly known as:  CRESTOR  Take 10 mg  by mouth at bedtime.     vitamin C 500 MG tablet  Commonly known as:  ASCORBIC ACID  Take 1,000 mg by mouth daily.     Vitamin D 1000 UNITS capsule  Take 1,000 Units by mouth daily.           Janne Napoleon, NP 03/02/13 1040

## 2013-03-02 NOTE — ED Provider Notes (Signed)
See prior note   Ward Givens, MD 03/02/13 1113

## 2013-03-02 NOTE — ED Notes (Signed)
Phoned [facility] with instructions. They stated they would come pick pt up. Son is aware of d/c instructions.

## 2013-03-02 NOTE — ED Notes (Addendum)
Pt rolled out of bed during the night. Abrasion to right side of forehead. Pt is a resident of [address] in the alzheimer's unit. Pts mentation is at baseline. Pts only current complaint is, "My head hurts a little". Pt is not on an anticoagulant according to medication record.

## 2013-03-02 NOTE — ED Notes (Signed)
Abrasion cleansed and antibiotic ointment applied

## 2013-03-02 NOTE — ED Notes (Signed)
Walked pt a few steps. Pts son states pt has chronic knee pain and usually only ambulates to the wheelchair form bed. Pt did well with assistance. Ice pack applied to forehead.

## 2013-03-02 NOTE — ED Provider Notes (Signed)
9:01 AM- Checked on pt, and the pt was out of the room.   9:55 AM-- The pt is a resident at [facility] and she fell out of her bed. She states that she needed to use the restroom. Pt has dementia. She has a superficial abrasion to right forehead. She is extremely confused, which is her baseline.   Medical screening examination/treatment/procedure(s) were conducted as a shared visit with non-physician practitioner(s) and myself.  I personally evaluated the patient during the encounter  Devoria Albe, MD, FACEP  I personally performed the services described in this documentation, which was scribed in my presence. The recorded information has been reviewed and considered.  Devoria Albe, MD, [facility]   Ward Givens, MD 03/02/13 [phone]

## 2013-04-16 ENCOUNTER — Encounter (HOSPITAL_COMMUNITY): Payer: Self-pay | Admitting: Emergency Medicine

## 2013-04-16 ENCOUNTER — Emergency Department (HOSPITAL_COMMUNITY): Payer: Medicare Other

## 2013-04-16 ENCOUNTER — Emergency Department (HOSPITAL_COMMUNITY)
Admission: EM | Admit: 2013-04-16 | Discharge: 2013-04-16 | Disposition: A | Discharge status: Skilled Nursing Facility | Payer: Medicare Other | Attending: Emergency Medicine | Admitting: Emergency Medicine

## 2013-04-16 DIAGNOSIS — Z8659 Personal history of other mental and behavioral disorders: Secondary | ICD-10-CM

## 2013-04-16 DIAGNOSIS — R296 Repeated falls: Secondary | ICD-10-CM | POA: Insufficient documentation

## 2013-04-16 DIAGNOSIS — Z8781 Personal history of (healed) traumatic fracture: Secondary | ICD-10-CM | POA: Insufficient documentation

## 2013-04-16 DIAGNOSIS — S7000XA Contusion of unspecified hip, initial encounter: Secondary | ICD-10-CM | POA: Insufficient documentation

## 2013-04-16 DIAGNOSIS — I10 Essential (primary) hypertension: Secondary | ICD-10-CM | POA: Insufficient documentation

## 2013-04-16 DIAGNOSIS — F411 Generalized anxiety disorder: Secondary | ICD-10-CM | POA: Insufficient documentation

## 2013-04-16 DIAGNOSIS — F329 Major depressive disorder, single episode, unspecified: Secondary | ICD-10-CM | POA: Insufficient documentation

## 2013-04-16 DIAGNOSIS — S0990XA Unspecified injury of head, initial encounter: Secondary | ICD-10-CM | POA: Insufficient documentation

## 2013-04-16 DIAGNOSIS — F0280 Dementia in other diseases classified elsewhere without behavioral disturbance: Secondary | ICD-10-CM | POA: Insufficient documentation

## 2013-04-16 DIAGNOSIS — S7002XA Contusion of left hip, initial encounter: Secondary | ICD-10-CM

## 2013-04-16 DIAGNOSIS — K219 Gastro-esophageal reflux disease without esophagitis: Secondary | ICD-10-CM | POA: Insufficient documentation

## 2013-04-16 DIAGNOSIS — Z85038 Personal history of other malignant neoplasm of large intestine: Secondary | ICD-10-CM | POA: Insufficient documentation

## 2013-04-16 DIAGNOSIS — Z791 Long term (current) use of non-steroidal anti-inflammatories (NSAID): Secondary | ICD-10-CM | POA: Insufficient documentation

## 2013-04-16 DIAGNOSIS — Y9301 Activity, walking, marching and hiking: Secondary | ICD-10-CM | POA: Insufficient documentation

## 2013-04-16 DIAGNOSIS — E785 Hyperlipidemia, unspecified: Secondary | ICD-10-CM | POA: Insufficient documentation

## 2013-04-16 DIAGNOSIS — Y9210 Unspecified residential institution as the place of occurrence of the external cause: Secondary | ICD-10-CM | POA: Insufficient documentation

## 2013-04-16 DIAGNOSIS — W19XXXA Unspecified fall, initial encounter: Secondary | ICD-10-CM

## 2013-04-16 DIAGNOSIS — Z79899 Other long term (current) drug therapy: Secondary | ICD-10-CM | POA: Insufficient documentation

## 2013-04-16 DIAGNOSIS — F3289 Other specified depressive episodes: Secondary | ICD-10-CM | POA: Insufficient documentation

## 2013-04-16 DIAGNOSIS — G309 Alzheimer's disease, unspecified: Secondary | ICD-10-CM | POA: Insufficient documentation

## 2013-04-16 DIAGNOSIS — S0993XA Unspecified injury of face, initial encounter: Secondary | ICD-10-CM | POA: Insufficient documentation

## 2013-04-16 NOTE — ED Notes (Signed)
Pt has HX of Alzheimer's, usually uses a wheelchair, today walked to bathroom, found on ground. Unclear if pt has any injuries, no obvious deformities per EMS, no loss of ROM, unclear if pt struck head, no indication.

## 2013-04-16 NOTE — ED Notes (Signed)
Pt's son at bedside, given update on pt's status.

## 2013-04-16 NOTE — ED Notes (Signed)
Pt is a resident at [facility].

## 2013-04-16 NOTE — ED Notes (Signed)
Discharge instructions reviewed with pt's family member and nursing home staff

## 2013-04-16 NOTE — ED Provider Notes (Signed)
CSN: [id number]     Arrival date & time 04/16/13  2841 History   This chart was scribed for Kathy Lyons, MD, by Yevette Edwards, ED Scribe. This patient was seen in room APA05/AP[facility]05 and the patient's care was started at 9:59 AM.  First MD Initiated Contact with Patient 04/16/13 [phone]     Chief Complaint  Patient presents with  . Fall   [place] (Dementia)   The history is provided by the patient, medical records and the EMS personnel. The history is limited by the condition of the patient. No language interpreter was used.   HPI Comments: Kathy Avery is a 79 y.o. female, with a h/o Alzheimer's and HTN, who was sent from [place] to the Emergency Department via EMS complaining of an unwitnessed fall which occurred at an unknown time this morning; the pt was found on the floor of the bathroom. She usually utilizes a wheelchair, but she apparently walked to the bathroom today. The pt does not recall the events of this morning. She did not know that she was in the hospital, and she could not recall who found her on the bathroom floor. The pt stated that she fell on the way home from work and she asserted that she lived independently. She stated she is experiencing pain to her left hip and mild neck pain; however, she later stated she was not experiencing any pain anywhere.  She denies a headache and chest pain. The pt has a h/o pelvic surgery due to a fracture. She is a non-smoker.   Past Medical History  Diagnosis Date  . Hypertension   . Depression   . Anxiety   . Alzheimer disease   . GERD (gastroesophageal reflux disease)   . Hyperlipidemia   . Pelvic fracture   . Clavicular fracture   . Cancer     colon   Past Surgical History  Procedure Laterality Date  . Fracture surgery      pelvis   History reviewed. No pertinent family history. History  Substance Use Topics  . Smoking status: Never Smoker   . Smokeless tobacco: Never Used  . Alcohol Use: No   No OB  history provided.  Review of Systems  Unable to perform ROS: Dementia    Allergies  Morphine and related  Home Medications   Current Outpatient Rx  Name  Route  Sig  Dispense  Refill  . aliskiren (TEKTURNA) 300 MG tablet   Oral   Take 300 mg by mouth daily.         . Calcium Carbonate-Vitamin D (CALCIUM 600/VITAMIN D PO)   Oral   Take 1 tablet by mouth daily.           . Cholecalciferol (VITAMIN D) 1000 UNITS capsule   Oral   Take 1,000 Units by mouth daily.           . citalopram (CELEXA) 40 MG tablet   Oral   Take 40 mg by mouth daily.          Marland Kitchen HYDROcodone-acetaminophen (NORCO) 5-325 MG per tablet   Oral   Take 1 tablet by mouth 2 (two) times daily. Given at 9am and 4pm         . hydrOXYzine (VISTARIL) 25 MG capsule   Oral   Take 25 mg by mouth at bedtime.         . hydrOXYzine (VISTARIL) 25 MG capsule   Oral   Take 25 mg by mouth  2 (two) times daily as needed for anxiety.         Marland Kitchen loperamide (IMODIUM) 2 MG capsule   Oral   Take 2 mg by mouth 3 (three) times daily as needed for diarrhea or loose stools.         . Melatonin 10 MG CAPS   Oral   Take 10 mg by mouth at bedtime as needed.         . meloxicam (MOBIC) 7.5 MG tablet   Oral   Take 7.5 mg by mouth daily.         . Memantine HCl ER (NAMENDA XR) 28 MG CP24   Oral   Take 28 mg by mouth daily.         Marland Kitchen omeprazole (PRILOSEC) 20 MG capsule   Oral   Take 20 mg by mouth every morning.          . ondansetron (ZOFRAN-ODT) 8 MG disintegrating tablet   Oral   Take 8 mg by mouth every 4 (four) hours as needed for nausea. nausea         . polyethylene glycol (MIRALAX / GLYCOLAX) packet   Oral   Take 17 g by mouth daily.           . ranitidine (ZANTAC) 150 MG capsule   Oral   Take 150 mg by mouth every evening.           . rivastigmine (EXELON) 9.5 mg/24hr   Transdermal   Place 1 patch onto the skin daily.           . rosuvastatin (CRESTOR) 10 MG tablet    Oral   Take 10 mg by mouth at bedtime.          . vitamin C (ASCORBIC ACID) 500 MG tablet   Oral   Take 1,000 mg by mouth daily.           BP 163/85  Pulse 75  Temp(Src) 98.4 F (36.9 C) (Oral)  Resp 19  Physical Exam  Nursing note and vitals reviewed. Constitutional: She appears well-developed and well-nourished. No distress.  In no acute distress.   HENT:  Head: Normocephalic and atraumatic.  Eyes: Conjunctivae and EOM are normal. Pupils are equal, round, and reactive to light.  Neck: Normal range of motion. Neck supple. No tracheal deviation present.  Cardiovascular: Normal rate, normal heart sounds and intact distal pulses.   No murmur heard. Pulmonary/Chest: Effort normal and breath sounds normal. No respiratory distress. She has no wheezes. She has no rales.  Abdominal: Soft. There is no tenderness. There is no rebound.  Musculoskeletal: Normal range of motion.  Mild tenderness to palpation over lateral aspect of left hip.   Neurological: She is alert.  Skin: Skin is warm and dry.  Psychiatric: She has a normal mood and affect. Her behavior is normal.    ED Course  Procedures (including critical care time)  DIAGNOSTIC STUDIES: Oxygen Saturation is % on room air, normal by my interpretation.    COORDINATION OF CARE:  10:05 AM- Discussed treatment plan with patient which includes imaging, and the patient agreed to the plan.   Labs Review Labs Reviewed - No data to display Imaging Review No results found.    MDM  No diagnosis found. Patient is an 79 year old female with history of dementia. She stays at [facility] for this morning she was found on the floor the bathroom. She is uncertain of the what happened whether she tripped  or how she got there, however she denies any discomfort with the exception of her left hip. X-rays of the hip are negative and CT of the brain is unremarkable. She appears quite well and is very pleasant and in no distress.  Neurologic exam is nonfocal and there is no evidence for trauma. At this point I feel as though she is stable for discharge to return as needed for any problems.  I personally performed the services described in this documentation, which was scribed in my presence. The recorded information has been reviewed and is accurate.       Kathy Lyons, MD 04/16/13 1101

## 2013-04-17 ENCOUNTER — Emergency Department (HOSPITAL_COMMUNITY): Payer: Medicare Other

## 2013-04-17 ENCOUNTER — Encounter (HOSPITAL_COMMUNITY): Payer: Self-pay | Admitting: Emergency Medicine

## 2013-04-17 ENCOUNTER — Emergency Department (HOSPITAL_COMMUNITY)
Admission: EM | Admit: 2013-04-17 | Discharge: 2013-04-17 | Disposition: A | Discharge status: Skilled Nursing Facility | Payer: Medicare Other | Attending: Emergency Medicine | Admitting: Emergency Medicine

## 2013-04-17 DIAGNOSIS — F3289 Other specified depressive episodes: Secondary | ICD-10-CM | POA: Insufficient documentation

## 2013-04-17 DIAGNOSIS — Y9210 Unspecified residential institution as the place of occurrence of the external cause: Secondary | ICD-10-CM | POA: Insufficient documentation

## 2013-04-17 DIAGNOSIS — K219 Gastro-esophageal reflux disease without esophagitis: Secondary | ICD-10-CM | POA: Insufficient documentation

## 2013-04-17 DIAGNOSIS — I10 Essential (primary) hypertension: Secondary | ICD-10-CM | POA: Insufficient documentation

## 2013-04-17 DIAGNOSIS — Y9389 Activity, other specified: Secondary | ICD-10-CM | POA: Insufficient documentation

## 2013-04-17 DIAGNOSIS — Z79899 Other long term (current) drug therapy: Secondary | ICD-10-CM | POA: Insufficient documentation

## 2013-04-17 DIAGNOSIS — R296 Repeated falls: Secondary | ICD-10-CM | POA: Insufficient documentation

## 2013-04-17 DIAGNOSIS — Z993 Dependence on wheelchair: Secondary | ICD-10-CM | POA: Insufficient documentation

## 2013-04-17 DIAGNOSIS — F329 Major depressive disorder, single episode, unspecified: Secondary | ICD-10-CM | POA: Insufficient documentation

## 2013-04-17 DIAGNOSIS — Z791 Long term (current) use of non-steroidal anti-inflammatories (NSAID): Secondary | ICD-10-CM | POA: Insufficient documentation

## 2013-04-17 DIAGNOSIS — S72409A Unspecified fracture of lower end of unspecified femur, initial encounter for closed fracture: Secondary | ICD-10-CM | POA: Insufficient documentation

## 2013-04-17 DIAGNOSIS — S7292XA Unspecified fracture of left femur, initial encounter for closed fracture: Secondary | ICD-10-CM

## 2013-04-17 DIAGNOSIS — G309 Alzheimer's disease, unspecified: Secondary | ICD-10-CM | POA: Insufficient documentation

## 2013-04-17 DIAGNOSIS — S79919A Unspecified injury of unspecified hip, initial encounter: Secondary | ICD-10-CM | POA: Insufficient documentation

## 2013-04-17 DIAGNOSIS — F411 Generalized anxiety disorder: Secondary | ICD-10-CM | POA: Insufficient documentation

## 2013-04-17 DIAGNOSIS — Z85038 Personal history of other malignant neoplasm of large intestine: Secondary | ICD-10-CM | POA: Insufficient documentation

## 2013-04-17 DIAGNOSIS — Z96649 Presence of unspecified artificial hip joint: Secondary | ICD-10-CM | POA: Insufficient documentation

## 2013-04-17 DIAGNOSIS — F0280 Dementia in other diseases classified elsewhere without behavioral disturbance: Secondary | ICD-10-CM | POA: Insufficient documentation

## 2013-04-17 LAB — CBC WITH DIFFERENTIAL/PLATELET
Basophils Relative: 0 % (ref 0–1)
Eosinophils Absolute: 0.1 10*3/uL (ref 0.0–0.7)
Eosinophils Relative: 1 % (ref 0–5)
MCH: 30.6 pg (ref 26.0–34.0)
MCHC: 32.5 g/dL (ref 30.0–36.0)
MCV: 94 fL (ref 78.0–100.0)
Monocytes Relative: 10 % (ref 3–12)
Neutrophils Relative %: 72 % (ref 43–77)
Platelets: 152 10*3/uL (ref 150–400)

## 2013-04-17 LAB — BASIC METABOLIC PANEL
BUN: 30 mg/dL — ABNORMAL HIGH (ref 6–23)
Calcium: 9.1 mg/dL (ref 8.4–10.5)
GFR calc non Af Amer: 43 mL/min — ABNORMAL LOW (ref >90)
Glucose, Bld: 102 mg/dL — ABNORMAL HIGH (ref 70–99)

## 2013-04-17 LAB — PROTIME-INR: INR: 1.25 (ref 0.00–1.49)

## 2013-04-17 MED ORDER — ONDANSETRON HCL 4 MG/2ML IJ SOLN
4.0000 mg | Freq: Once | INTRAMUSCULAR | Status: AC
  Administered 2013-04-17: 4 mg via INTRAVENOUS
  Filled 2013-04-17: qty 2

## 2013-04-17 MED ORDER — FENTANYL CITRATE 0.05 MG/ML IJ SOLN
50.0000 ug | Freq: Once | INTRAMUSCULAR | Status: AC
  Administered 2013-04-17: 50 ug via INTRAVENOUS

## 2013-04-17 MED ORDER — ASPIRIN 81 MG PO CHEW: 81.0000 mg | CHEWABLE_TABLET | Freq: Every day | ORAL | Status: AC

## 2013-04-17 MED ORDER — SODIUM CHLORIDE 0.9 % IV SOLN
1000.0000 mL | INTRAVENOUS | Status: DC
  Administered 2013-04-17: 1000 mL via INTRAVENOUS

## 2013-04-17 MED ORDER — FENTANYL CITRATE 0.05 MG/ML IJ SOLN
50.0000 ug | INTRAMUSCULAR | Status: AC | PRN
  Administered 2013-04-17 (×2): 50 ug via INTRAVENOUS
  Filled 2013-04-17 (×2): qty 2

## 2013-04-17 MED ORDER — HYDROCODONE-ACETAMINOPHEN 5-325 MG PO TABS: 1.0000 | ORAL_TABLET | Freq: Four times a day (QID) | ORAL | Status: DC | PRN

## 2013-04-17 MED ORDER — FENTANYL CITRATE 0.05 MG/ML IJ SOLN
50.0000 ug | Freq: Once | INTRAMUSCULAR | Status: AC
  Administered 2013-04-17: 50 ug via INTRAVENOUS
  Filled 2013-04-17: qty 2

## 2013-04-17 MED ORDER — HYDROCODONE-ACETAMINOPHEN 5-325 MG PO TABS
1.0000 | ORAL_TABLET | Freq: Once | ORAL | Status: AC
  Administered 2013-04-17: 1 via ORAL
  Filled 2013-04-17: qty 1

## 2013-04-17 NOTE — ED Provider Notes (Signed)
CSN: [id number]     Arrival date & time 04/17/13  0341 History   First MD Initiated Contact with Patient 04/17/13 0351     Chief Complaint  Patient presents with  . Fall  . Leg Injury   (Consider location/radiation/quality/duration/timing/severity/associated sxs/prior Treatment) HPI HX per EMS, nursing home report and son bedside, has PMH Dementia and at baseline does not walk.  She does transfer from wheelchair to bed with assistance.  She has h/o bilateral hip replacements. Today she was found down at nursing home com[plaining of L hip pain.  She was noted to have shortening of her LLE. She denies any other pain, unsure of she hit her head. Level 5 caveat applies due to dementia.  Past Medical History  Diagnosis Date  . Hypertension   . Depression   . Anxiety   . Alzheimer disease   . GERD (gastroesophageal reflux disease)   . Hyperlipidemia   . Pelvic fracture   . Clavicular fracture   . Cancer     colon   Past Surgical History  Procedure Laterality Date  . Fracture surgery      pelvis   History reviewed. No pertinent family history. History  Substance Use Topics  . Smoking status: Never Smoker   . Smokeless tobacco: Never Used  . Alcohol Use: No   OB History   Grav Para Term Preterm Abortions TAB SAB Ect Mult Living                 Review of Systems  Unable to perform ROS level 5 caveat as above  Allergies  Morphine and related  Home Medications   Current Outpatient Rx  Name  Route  Sig  Dispense  Refill  . aliskiren (TEKTURNA) 300 MG tablet   Oral   Take 300 mg by mouth daily.         . Calcium Carbonate-Vitamin D (CALCIUM 600/VITAMIN D PO)   Oral   Take 1 tablet by mouth daily.           . Cholecalciferol (VITAMIN D) 1000 UNITS capsule   Oral   Take 1,000 Units by mouth daily.           . citalopram (CELEXA) 40 MG tablet   Oral   Take 40 mg by mouth daily.          Marland Kitchen HYDROcodone-acetaminophen (NORCO) 5-325 MG per tablet   Oral   Take  1 tablet by mouth 2 (two) times daily. Given at 9am and 4pm         . hydrOXYzine (VISTARIL) 25 MG capsule   Oral   Take 25 mg by mouth 2 (two) times daily as needed for anxiety.          Marland Kitchen loperamide (IMODIUM) 2 MG capsule   Oral   Take 2 mg by mouth 3 (three) times daily as needed for diarrhea or loose stools.         . meloxicam (MOBIC) 7.5 MG tablet   Oral   Take 7.5 mg by mouth daily.         . Memantine HCl ER (NAMENDA XR) 28 MG CP24   Oral   Take 28 mg by mouth daily.         Marland Kitchen omeprazole (PRILOSEC) 20 MG capsule   Oral   Take 20 mg by mouth every morning.          . ondansetron (ZOFRAN-ODT) 8 MG disintegrating tablet   Oral  Take 8 mg by mouth every 4 (four) hours as needed for nausea. nausea         . polyethylene glycol (MIRALAX / GLYCOLAX) packet   Oral   Take 17 g by mouth daily.           . ranitidine (ZANTAC) 150 MG capsule   Oral   Take 150 mg by mouth every evening.           . rivastigmine (EXELON) 9.5 mg/24hr   Transdermal   Place 1 patch onto the skin daily.           . rosuvastatin (CRESTOR) 10 MG tablet   Oral   Take 10 mg by mouth at bedtime.          . vitamin C (ASCORBIC ACID) 500 MG tablet   Oral   Take 1,000 mg by mouth daily.           BP 158/99  Pulse 76  Temp(Src) 98.1 F (36.7 C) (Oral)  Resp 20  Wt 140 lb (63.504 kg)  SpO2 95% Physical Exam  Constitutional: She appears well-developed and well-nourished.  HENT:  Head: Normocephalic and atraumatic.  Eyes: EOM are normal. Pupils are equal, round, and reactive to light.  Neck:  c collar in place. No tenderness or deformity  Cardiovascular: Normal rate, regular rhythm and intact distal pulses.   Pulmonary/Chest: Effort normal and breath sounds normal. No respiratory distress.  Abdominal: Soft. There is no tenderness.  Musculoskeletal:  Pelvis stable, tenderness posterior lateral left hip with shortening of LLE versus right. Distal pulses and motor/  sensorium intact.  She has pain with movement of LLE, able to flex her hip with good ROM.    Skin: Skin is warm and dry.    ED Course  Procedures (including critical care time) Labs Review Labs Reviewed  BASIC METABOLIC PANEL - Abnormal; Notable for the following:    Glucose, Bld 102 (*)    BUN 30 (*)    Creatinine, Ser 1.12 (*)    GFR calc non Af Amer 43 (*)    GFR calc Af Amer 50 (*)    All other components within normal limits  PROTIME-INR - Abnormal; Notable for the following:    Prothrombin Time 15.4 (*)    All other components within normal limits  CBC WITH DIFFERENTIAL  URINALYSIS, ROUTINE W REFLEX MICROSCOPIC  TYPE AND SCREEN   Imaging Review Dg Chest 1 View  04/17/2013   *RADIOLOGY REPORT*  Clinical Data: Fall  CHEST - 1 VIEW  Comparison: 08/23/2012  Findings: Prominent cardiomediastinal contours.  Aortic atherosclerosis.  Interstitial prominence.  Elevated right hemidiaphragm.  Linear retrocardiac opacity.  No pleural effusion or pneumothorax.  Gentle leftward curvature of the spine.  No acute osseous finding appreciated.  Right shoulder DJD.  Sequelae of prior left clavicle fracture.  IMPRESSION: Linear retrocardiac opacity; atelectasis versus infiltrate.  Prominent cardiomediastinal contours.   Original Report Authenticated By: Jearld Lesch, M.D.   Dg Hip Complete Left  04/17/2013   *RADIOLOGY REPORT*  Clinical Data: Fall, left leg pain  LEFT HIP - COMPLETE 2+ VIEW  Comparison: 04/16/2013  Findings: Bilateral total hip arthroplasties.  No periprosthetic lucency or dislocation.  Diffuse osteopenia.  Moderate stool burden.  IMPRESSION: Bilateral total hip arthroplasties.  No acute osseous finding.   Original Report Authenticated By: Jearld Lesch, M.D.   Dg Hip Complete Left  04/16/2013   CLINICAL DATA:  Fall  EXAM: LEFT HIP - COMPLETE 2+  VIEW  COMPARISON:  None.  FINDINGS: There are postoperative changes of bilateral total hip arthroplasties. The distal aspect of the  femoral stem component of the right arthroplasty is not completely included imaged in the image. The left femoral stem component is completely imaged. No hardware loosening or periprosthetic fracture is identified. The bony pelvis appears intact. The pubic symphysis and sacroiliac joints appear within normal limits. Calcified phleboliths in the pelvis.  IMPRESSION: Bilateral total hip arthroplasties. No acute bony abnormality identified.   Electronically Signed   By: Britta Mccreedy M.D.   On: 04/16/2013 10:38   Dg Femur Left  04/17/2013   CLINICAL DATA:  Fall, left hip pain.  EXAM: LEFT FEMUR - 2 VIEW  COMPARISON:  Pelvic and hip radiograph April 16, 2013 and April 17, 2013 at 04:29 a.m.  FINDINGS: Status post left hip total arthroplasty, with intact well-seated components, cemented femoral stem. No periprosthetic lucency. Linear lucency through the proximal lateral femoral cortex, approximately 3.6 cm above the femur intra medullary distal tip. No dislocation. No destructive bony lesions. Phleboliths in the pelvis. Moderate vascular calcifications.  IMPRESSION: Linear lucency through lateral femoral cortex concerning for nondisplaced fracture, at the level of the distal enter medullary stem, status post left hip total arthroplasty.   Electronically Signed   By: Awilda Metro   On: 04/17/2013 06:02   Ct Head Wo Contrast  04/17/2013   *RADIOLOGY REPORT*  Clinical Data:  Fall, dementia  CT HEAD WITHOUT CONTRAST CT CERVICAL SPINE WITHOUT CONTRAST  Technique:  Multidetector CT imaging of the head and cervical spine was performed following the standard protocol without intravenous contrast.  Multiplanar CT image reconstructions of the cervical spine were also generated.  Comparison:  04/16/2013 head CT, 03/02/2013 c-spine CT  CT HEAD  Findings: Prominence of the sulci, cisterns, and ventricles, in keeping with volume loss. There are subcortical and periventricular white matter hypodensities, a nonspecific  finding most often seen with chronic microangiopathic changes.  There is no evidence for acute hemorrhage, overt hydrocephalus, mass lesion, or abnormal extra-axial fluid collection.  No definite CT evidence for acute cortical based (large artery) infarction. Sequelae of prior bilateral basal ganglia lacunar infarctions. Atherosclerotic vascular calcifications. The visualized paranasal sinuses and mastoid air cells are predominately clear.  No displaced calvarial fracture.  IMPRESSION: Volume loss, white matter changes, and remote lacunar infarctions. No CT evidence of acute intracranial abnormality.  CT CERVICAL SPINE  Findings: Biapical scarring.  Atherosclerotic vascular calcifications.  Maintained craniocervical relationship.  No dens fracture. Inferior endplate irregularity at C5 is similar to prior. C5-6 degenerative disc disease.  Minimal anterolisthesis of C4 on C5.  Multilevel facet arthropathy.  Prevertebral soft tissues within normal limits.  IMPRESSION: Multilevel degenerative changes.  No acute osseous abnormality of the cervical spine.   Original Report Authenticated By: Jearld Lesch, M.D.   Ct Head Wo Contrast  04/16/2013   CLINICAL DATA:  Fall, dementia  EXAM: CT HEAD WITHOUT CONTRAST  TECHNIQUE: Contiguous axial images were obtained from the base of the skull through the vertex without intravenous contrast.  COMPARISON:  03/02/2013  FINDINGS: No evidence of parenchymal hemorrhage or extra-axial fluid collection. No mass lesion, mass effect, or midline shift.  No CT evidence of acute infarction.  Extensive subcortical white matter and periventricular small vessel ischemic changes. Intracranial atherosclerosis.  Global cortical and central atrophy. Secondary ventriculomegaly, unchanged.  The visualized paranasal sinuses are essentially clear. The mastoid air cells are unopacified.  No evidence of calvarial fracture.  IMPRESSION:  No evidence of acute intracranial abnormality.  Atrophy with  secondary ventriculomegaly, unchanged.  Extensive small vessel ischemic changes with intracranial atherosclerosis.   Electronically Signed   By: Charline Bills M.D.   On: 04/16/2013 10:45   Ct Cervical Spine Wo Contrast  04/17/2013   *RADIOLOGY REPORT*  Clinical Data: Fall, dementia  CT HEAD WITHOUT CONTRAST CT CERVICAL SPINE WITHOUT CONTRAST  Technique: Multidetector CT imaging of the head and cervical spine was performed following the standard protocol without intravenous contrast. Multiplanar CT image reconstructions of the cervical spine were also generated.  Comparison: 04/16/2013 head CT, 03/02/2013 c-spine CT  CT HEAD  Findings: Prominence of the sulci, cisterns, and ventricles, in keeping with volume loss. There are subcortical and periventricular white matter hypodensities, a nonspecific finding most often seen with chronic microangiopathic changes.  There is no evidence for acute hemorrhage, overt hydrocephalus, mass lesion, or abnormal extra-axial fluid collection. No definite CT evidence for acute cortical based (large artery) infarction. Sequelae of prior bilateral basal ganglia lacunar infarctions. Atherosclerotic vascular calcifications. The visualized paranasal sinuses and mastoid air cells are predominately clear. No displaced calvarial fracture.  IMPRESSION: Volume loss, white matter changes, and remote lacunar infarctions. No CT evidence of acute intracranial abnormality.  CT CERVICAL SPINE  Findings: Biapical scarring. Atherosclerotic vascular calcifications. Maintained craniocervical relationship. No dens fracture. Inferior endplate irregularity at C5 is similar to prior. C5-6 degenerative disc disease. Minimal anterolisthesis of C4 on C5. Multilevel facet arthropathy. Prevertebral soft tissues within normal limits.  IMPRESSION: Multilevel degenerative changes. No acute osseous abnormality of the cervical spine.   Original Report Authenticated By: Jearld Lesch, M.D.    7:40 AM d/w  Juliene Pina, recs [facility]B, KI with linear to prevent skin break down, and see Dr Charlann Boxer on Wed afternoon, start baby ASA daily.     MDM  DX: femur fracture after fall  Imaging Pain control Ortho consult and follow up    Sunnie Nielsen, MD 04/18/13 1606

## 2013-04-17 NOTE — ED Notes (Signed)
Pt fell while walking to the bathroom unassisted. EMS found pts head against the bathroom door and pt c/o severe left upper leg pain. Pt in c-collar, head blocks, and lsb.

## 2013-04-17 NOTE — ED Notes (Signed)
EMS called to transport pt back to [facility].

## 2013-04-17 NOTE — ED Notes (Signed)
Spoke with Marchelle Folks at [facility] -given discharge instructions for non-weight bearing to left leg.  States is unable to care for pt with restrictions but will work on it when she gets back to [facility].  Plan discussed with pt's son and verbalized understanding.  Pt d/c with EMS to [facility].

## 2013-04-17 NOTE — ED Notes (Signed)
Family concerned about [employer] caring for pt and keeping pain under control.  edp notified.  [employer] called to discuss plan of care, awaiting return call.  New orders received from edp.

## 2013-05-16 ENCOUNTER — Emergency Department (HOSPITAL_COMMUNITY)
Admission: EM | Admit: 2013-05-16 | Discharge: 2013-05-16 | Disposition: A | Discharge status: Skilled Nursing Facility | Payer: Medicare Other | Attending: Emergency Medicine | Admitting: Emergency Medicine

## 2013-05-16 ENCOUNTER — Encounter (HOSPITAL_COMMUNITY): Payer: Self-pay | Admitting: Emergency Medicine

## 2013-05-16 ENCOUNTER — Emergency Department (HOSPITAL_COMMUNITY): Payer: Medicare Other

## 2013-05-16 DIAGNOSIS — Z7982 Long term (current) use of aspirin: Secondary | ICD-10-CM | POA: Insufficient documentation

## 2013-05-16 DIAGNOSIS — Z8781 Personal history of (healed) traumatic fracture: Secondary | ICD-10-CM | POA: Insufficient documentation

## 2013-05-16 DIAGNOSIS — R42 Dizziness and giddiness: Secondary | ICD-10-CM | POA: Insufficient documentation

## 2013-05-16 DIAGNOSIS — R1110 Vomiting, unspecified: Secondary | ICD-10-CM | POA: Insufficient documentation

## 2013-05-16 DIAGNOSIS — R55 Syncope and collapse: Secondary | ICD-10-CM | POA: Insufficient documentation

## 2013-05-16 DIAGNOSIS — I10 Essential (primary) hypertension: Secondary | ICD-10-CM | POA: Insufficient documentation

## 2013-05-16 DIAGNOSIS — R059 Cough, unspecified: Secondary | ICD-10-CM | POA: Insufficient documentation

## 2013-05-16 DIAGNOSIS — E785 Hyperlipidemia, unspecified: Secondary | ICD-10-CM | POA: Insufficient documentation

## 2013-05-16 DIAGNOSIS — F3289 Other specified depressive episodes: Secondary | ICD-10-CM | POA: Insufficient documentation

## 2013-05-16 DIAGNOSIS — K219 Gastro-esophageal reflux disease without esophagitis: Secondary | ICD-10-CM | POA: Insufficient documentation

## 2013-05-16 DIAGNOSIS — F0280 Dementia in other diseases classified elsewhere without behavioral disturbance: Secondary | ICD-10-CM | POA: Insufficient documentation

## 2013-05-16 DIAGNOSIS — G309 Alzheimer's disease, unspecified: Secondary | ICD-10-CM | POA: Insufficient documentation

## 2013-05-16 DIAGNOSIS — R05 Cough: Secondary | ICD-10-CM | POA: Insufficient documentation

## 2013-05-16 DIAGNOSIS — Z79899 Other long term (current) drug therapy: Secondary | ICD-10-CM | POA: Insufficient documentation

## 2013-05-16 DIAGNOSIS — Z85038 Personal history of other malignant neoplasm of large intestine: Secondary | ICD-10-CM | POA: Insufficient documentation

## 2013-05-16 DIAGNOSIS — F329 Major depressive disorder, single episode, unspecified: Secondary | ICD-10-CM | POA: Insufficient documentation

## 2013-05-16 DIAGNOSIS — F411 Generalized anxiety disorder: Secondary | ICD-10-CM | POA: Insufficient documentation

## 2013-05-16 DIAGNOSIS — Z791 Long term (current) use of non-steroidal anti-inflammatories (NSAID): Secondary | ICD-10-CM | POA: Insufficient documentation

## 2013-05-16 LAB — CBC WITH DIFFERENTIAL/PLATELET
Basophils Absolute: 0 10*3/uL (ref 0.0–0.1)
Eosinophils Absolute: 0.1 10*3/uL (ref 0.0–0.7)
Eosinophils Relative: 1 % (ref 0–5)
Lymphocytes Relative: 14 % (ref 12–46)
MCH: 30.5 pg (ref 26.0–34.0)
MCV: 95.4 fL (ref 78.0–100.0)
Neutro Abs: 7.4 10*3/uL (ref 1.7–7.7)
Platelets: 110 10*3/uL — ABNORMAL LOW (ref 150–400)
RDW: 13.6 % (ref 11.5–15.5)
WBC: 9.6 10*3/uL (ref 4.0–10.5)

## 2013-05-16 LAB — BASIC METABOLIC PANEL
CO2: 21 mEq/L (ref 19–32)
Calcium: 9 mg/dL (ref 8.4–10.5)
Chloride: 98 mEq/L (ref 96–112)
GFR calc Af Amer: 45 mL/min — ABNORMAL LOW (ref >90)
GFR calc non Af Amer: 39 mL/min — ABNORMAL LOW (ref >90)
Sodium: 137 mEq/L (ref 137–147)

## 2013-05-16 LAB — TROPONIN I: Troponin I: <0.3 ng/mL (ref <0.30)

## 2013-05-16 MED ORDER — ONDANSETRON 4 MG PO TBDP: 4.0000 mg | ORAL_TABLET | Freq: Three times a day (TID) | ORAL | Status: DC | PRN

## 2013-05-16 NOTE — ED Provider Notes (Signed)
CSN: [id number]     Arrival date & time 05/16/13  2130 History  This chart was scribed for Shelda Jakes, MD by Dorothey Baseman, ED Scribe. This patient was seen in room APA02/APA02 and the patient's care was started at 10:34 AM.    Chief Complaint  Patient presents with  . Influenza   Patient is a 79 y.o. female presenting with flu symptoms. The history is provided by the patient and the nursing home (daughter). No language interpreter was used.  Influenza Severity:  Moderate Onset quality:  Sudden Progression:  Unchanged Chronicity:  New Risk factors: being elderly    Level 5 caveat- dementia   HPI Comments: Kathy Avery is a 79 y.o. female with a history of dementia brought in by EMS who presents to the Emergency Department from [facility] complaining of emesis and diarrhea onset this morning. The nursing home personnel with patient at bedside is unable to verify the emesis and diarrhea. She reports that the patient was also complaining of some lightheadedness and had a near-syncopal episode this morning. She reports that the patient has had a mild, dry cough for the past few days. She reports that the patient received a chest x-ray last night that was negative. She also reports that the patient sustained a fracture to the left hip a few months ago and is unable to bear weight. Patient has a history of HTN, GERD, hyperlipidemia, and colon cancer.  PCP- Dr. Catalina Pizza  Past Medical History  Diagnosis Date  . Hypertension   . Depression   . Anxiety   . Alzheimer disease   . GERD (gastroesophageal reflux disease)   . Hyperlipidemia   . Pelvic fracture   . Clavicular fracture   . Cancer     colon   Past Surgical History  Procedure Laterality Date  . Fracture surgery      pelvis   No family history on file. History  Substance Use Topics  . Smoking status: Never Smoker   . Smokeless tobacco: Never Used  . Alcohol Use: No   OB History   Grav Para Term Preterm  Abortions TAB SAB Ect Mult Living                 Review of Systems  Unable to perform ROS   Allergies  Morphine and related  Home Medications   Current Outpatient Rx  Name  Route  Sig  Dispense  Refill  . aliskiren (TEKTURNA) 300 MG tablet   Oral   Take 300 mg by mouth daily.         Marland Kitchen aspirin 81 MG chewable tablet   Oral   Chew 1 tablet (81 mg total) by mouth daily.   30 tablet   0   . Calcium Carbonate-Vitamin D (CALCIUM 600/VITAMIN D PO)   Oral   Take 1 tablet by mouth daily.           . Cholecalciferol (VITAMIN D) 1000 UNITS capsule   Oral   Take 1,000 Units by mouth daily.           . citalopram (CELEXA) 40 MG tablet   Oral   Take 40 mg by mouth daily.          Marland Kitchen docusate sodium (COLACE) 100 MG capsule   Oral   Take 100 mg by mouth 2 (two) times daily.         Marland Kitchen HYDROcodone-acetaminophen (NORCO) 5-325 MG per tablet   Oral  Take 1 tablet by mouth 2 (two) times daily. Give at 9am and 4pm         . hydrOXYzine (VISTARIL) 25 MG capsule   Oral   Take 25 mg by mouth 2 (two) times daily as needed for anxiety.          Marland Kitchen loperamide (IMODIUM) 2 MG capsule   Oral   Take 2 mg by mouth 3 (three) times daily as needed for diarrhea or loose stools.         . meloxicam (MOBIC) 7.5 MG tablet   Oral   Take 7.5 mg by mouth daily.         . Memantine HCl ER (NAMENDA XR) 28 MG CP24   Oral   Take 28 mg by mouth daily.         Marland Kitchen omeprazole (PRILOSEC) 20 MG capsule   Oral   Take 20 mg by mouth every morning.          . ondansetron (ZOFRAN-ODT) 8 MG disintegrating tablet   Oral   Take 8 mg by mouth every 4 (four) hours as needed for nausea. nausea         . polyethylene glycol (MIRALAX / GLYCOLAX) packet   Oral   Take 17 g by mouth daily.           . ranitidine (ZANTAC) 150 MG capsule   Oral   Take 150 mg by mouth every evening.           . rivastigmine (EXELON) 9.5 mg/24hr   Transdermal   Place 1 patch onto the skin daily.            . rosuvastatin (CRESTOR) 10 MG tablet   Oral   Take 10 mg by mouth at bedtime.          . vitamin C (ASCORBIC ACID) 500 MG tablet   Oral   Take 1,000 mg by mouth daily.          . ondansetron (ZOFRAN ODT) 4 MG disintegrating tablet   Oral   Take 1 tablet (4 mg total) by mouth every 8 (eight) hours as needed.   10 tablet   0    Triage Vitals: BP 108/55  Pulse 84  Temp(Src) 97.7 F (36.5 C) (Oral)  Resp 20  SpO2 98%  Physical Exam  Nursing note and vitals reviewed. Constitutional: She is oriented to person, place, and time. She appears well-developed and well-nourished. No distress.  HENT:  Head: Normocephalic and atraumatic.  Mucus membranes appear a little dry.   Eyes: Conjunctivae and EOM are normal.  Neck: Normal range of motion. Neck supple.  Cardiovascular: Normal rate, regular rhythm and normal heart sounds.   Pulmonary/Chest: Effort normal and breath sounds normal. No respiratory distress.  Abdominal: Soft. Bowel sounds are normal. She exhibits no distension. There is no tenderness.  Musculoskeletal: Normal range of motion. She exhibits no edema.  Knee immobilizer on the left leg. No appreciable leg swelling.   Neurological: She is alert and oriented to person, place, and time. No cranial nerve deficit. She exhibits normal muscle tone. Coordination normal.  Skin: Skin is warm and dry.  Psychiatric: She has a normal mood and affect. Her behavior is normal.    ED Course  Procedures (including critical care time)  DIAGNOSTIC STUDIES: Oxygen Saturation is 98% on room air, normal by my interpretation.    COORDINATION OF CARE: 10:40 AM- Will consult with [facility] to obtain more history. Discussed  treatment plan with patient at bedside and patient verbalized agreement.   11:03 AM- Nurse called [facility] and confirmed that the patient had one episode of emesis and one episode of possible diarrhea this morning.   Labs Review Labs Reviewed   CBC WITH DIFFERENTIAL - Abnormal; Notable for the following:    Hemoglobin 15.3 (*)    HCT 47.9 (*)    Platelets 110 (*)    All other components within normal limits  BASIC METABOLIC PANEL - Abnormal; Notable for the following:    Glucose, Bld 108 (*)    BUN 33 (*)    Creatinine, Ser 1.21 (*)    GFR calc non Af Amer 39 (*)    GFR calc Af Amer 45 (*)    All other components within normal limits  TROPONIN I  URINALYSIS, ROUTINE W REFLEX MICROSCOPIC   Results for orders placed during the hospital encounter of 05/16/13  CBC WITH DIFFERENTIAL      Result Value Range   WBC 9.6  4.0 - 10.5 K/uL   RBC 5.02  3.87 - 5.11 MIL/uL   Hemoglobin 15.3 (*) 12.0 - 15.0 g/dL   HCT 16.1 (*) 09.6 - 04.5 %   MCV 95.4  78.0 - 100.0 fL   MCH 30.5  26.0 - 34.0 pg   MCHC 31.9  30.0 - 36.0 g/dL   RDW 40.9  81.1 - 91.4 %   Platelets 110 (*) 150 - 400 K/uL   Neutrophils Relative % 77  43 - 77 %   Neutro Abs 7.4  1.7 - 7.7 K/uL   Lymphocytes Relative 14  12 - 46 %   Lymphs Abs 1.3  0.7 - 4.0 K/uL   Monocytes Relative 8  3 - 12 %   Monocytes Absolute 0.8  0.1 - 1.0 K/uL   Eosinophils Relative 1  0 - 5 %   Eosinophils Absolute 0.1  0.0 - 0.7 K/uL   Basophils Relative 0  0 - 1 %   Basophils Absolute 0.0  0.0 - 0.1 K/uL  BASIC METABOLIC PANEL      Result Value Range   Sodium 137  137 - 147 mEq/L   Potassium 4.0  3.7 - 5.3 mEq/L   Chloride 98  96 - 112 mEq/L   CO2 21  19 - 32 mEq/L   Glucose, Bld 108 (*) 70 - 99 mg/dL   BUN 33 (*) 6 - 23 mg/dL   Creatinine, Ser 7.82 (*) 0.50 - 1.10 mg/dL   Calcium 9.0  8.4 - 95.6 mg/dL   GFR calc non Af Amer 39 (*) >90 mL/min   GFR calc Af Amer 45 (*) >90 mL/min  TROPONIN I      Result Value Range   Troponin I <0.30  <0.30 ng/mL    Imaging Review Dg Chest Port 1 View  05/16/2013   CLINICAL DATA:  79 year old female with cough and congestion.  EXAM: PORTABLE CHEST - 1 VIEW  COMPARISON:  04/17/2013 and prior chest radiographs  FINDINGS: The cardiomediastinal  silhouette is unremarkable.  Mild left basilar scarring noted.  There is no evidence of focal airspace disease, pulmonary edema, suspicious pulmonary nodule/mass, pleural effusion, or pneumothorax. No acute bony abnormalities are identified.  A remote left clavicle fracture is present.  IMPRESSION: No evidence of acute cardiopulmonary disease.   Electronically Signed   By: Laveda Abbe M.D.   On: 05/16/2013 11:35    EKG Interpretation    Date/Time:  Tuesday May 16 2013 11:16:43 EST Ventricular Rate:  76 PR Interval:  132 QRS Duration: 84 QT Interval:  428 QTC Calculation: 481 R Axis:   62 Text Interpretation:  Normal sinus rhythm T wave abnormality, consider anterior ischemia Prolonged QT Abnormal ECG When compared with ECG of 14-Aug-2012 08:35, No significant change was found Confirmed by Clova Morlock  MD, Elizabeth Haff ([id number]) on 05/16/2013 11:45:33 AM            MDM   1. Near syncope    Workup for the near syncope and one episode of vomiting without any significant findings. Unable to get a urinalysis tried to catheterize the patient but she was too uncomfortable and not cooperative. Will treat with Zofran patient stable to return back to nursing facility. No further vomiting here. Vital signs have been stable here. EKG without any acute changes chest x-ray without evidence of any pneumonia or pulmonary edema. No significant leukocytosis. No significant anemia  I personally performed the services described in this documentation, which was scribed in my presence. The recorded information has been reviewed and is accurate.      Shelda Jakes, MD 05/16/13 1344

## 2013-05-16 NOTE — ED Notes (Signed)
Attempted in and out cath to obtain urine sample. Pt genitalia swollen and could not obtain urine sample. EDP aware and give verbal order to disregard urine order and not to reattempt.

## 2013-05-16 NOTE — ED Notes (Signed)
Pt placed on contact precautions as well.

## 2013-05-16 NOTE — ED Notes (Signed)
Conflicting reports from family and EMS. Facility called and asked about chain of events this am. Per Pt's nurse at [facility], pt got lightheaded this am while having BM. Per facility staff, pt usually has these symptoms while having a BM and comes back to baseline after BM. Per facility staff, pt had vomiting x1 and one large episode of diarrhea this am. Per facility pt has knee immobilizer placed due to Left femur fracture and pt received a chest xray yesterday due to facility having several patients with pneumonia. Chest xray yesterday showed no infiltrate.

## 2013-05-16 NOTE — Discharge Instructions (Signed)
Patient very stable here in the emergency department. Workup without any significant abnormalities. No vomiting. Stable to return back to a nursing facility. Would recommend Zofran as needed for any nausea or vomiting. Return for any newer worse symptoms.

## 2013-05-16 NOTE — ED Notes (Signed)
Complain of vomiting and diarrhea that started this morning

## 2013-05-22 ENCOUNTER — Emergency Department (HOSPITAL_COMMUNITY): Payer: Medicare Other

## 2013-05-22 ENCOUNTER — Inpatient Hospital Stay (HOSPITAL_COMMUNITY)
Admission: AD | Admit: 2013-05-22 | Discharge: 2013-05-24 | DRG: 312 | Disposition: A | Discharge status: Skilled Nursing Facility | Payer: Medicare Other | Attending: Cardiovascular Disease | Admitting: Cardiovascular Disease

## 2013-05-22 ENCOUNTER — Encounter (HOSPITAL_COMMUNITY): Payer: Self-pay | Admitting: Emergency Medicine

## 2013-05-22 DIAGNOSIS — IMO0002 Reserved for concepts with insufficient information to code with codable children: Secondary | ICD-10-CM

## 2013-05-22 DIAGNOSIS — E860 Dehydration: Secondary | ICD-10-CM | POA: Diagnosis present

## 2013-05-22 DIAGNOSIS — D638 Anemia in other chronic diseases classified elsewhere: Secondary | ICD-10-CM | POA: Diagnosis present

## 2013-05-22 DIAGNOSIS — I447 Left bundle-branch block, unspecified: Secondary | ICD-10-CM | POA: Diagnosis present

## 2013-05-22 DIAGNOSIS — G309 Alzheimer's disease, unspecified: Secondary | ICD-10-CM | POA: Diagnosis present

## 2013-05-22 DIAGNOSIS — F0280 Dementia in other diseases classified elsewhere without behavioral disturbance: Secondary | ICD-10-CM | POA: Diagnosis present

## 2013-05-22 DIAGNOSIS — E785 Hyperlipidemia, unspecified: Secondary | ICD-10-CM | POA: Diagnosis present

## 2013-05-22 DIAGNOSIS — K219 Gastro-esophageal reflux disease without esophagitis: Secondary | ICD-10-CM | POA: Diagnosis present

## 2013-05-22 DIAGNOSIS — I959 Hypotension, unspecified: Secondary | ICD-10-CM | POA: Diagnosis present

## 2013-05-22 DIAGNOSIS — F0390 Unspecified dementia without behavioral disturbance: Secondary | ICD-10-CM | POA: Diagnosis present

## 2013-05-22 DIAGNOSIS — N390 Urinary tract infection, site not specified: Secondary | ICD-10-CM | POA: Diagnosis present

## 2013-05-22 DIAGNOSIS — I519 Heart disease, unspecified: Secondary | ICD-10-CM | POA: Diagnosis present

## 2013-05-22 DIAGNOSIS — E43 Unspecified severe protein-calorie malnutrition: Secondary | ICD-10-CM | POA: Insufficient documentation

## 2013-05-22 DIAGNOSIS — R55 Syncope and collapse: Principal | ICD-10-CM | POA: Diagnosis present

## 2013-05-22 DIAGNOSIS — F411 Generalized anxiety disorder: Secondary | ICD-10-CM | POA: Diagnosis present

## 2013-05-22 DIAGNOSIS — Z885 Allergy status to narcotic agent status: Secondary | ICD-10-CM

## 2013-05-22 DIAGNOSIS — I10 Essential (primary) hypertension: Secondary | ICD-10-CM | POA: Diagnosis present

## 2013-05-22 LAB — CBC WITH DIFFERENTIAL/PLATELET
BASOS PCT: 0 % (ref 0–1)
Basophils Absolute: 0 10*3/uL (ref 0.0–0.1)
EOS ABS: 0.1 10*3/uL (ref 0.0–0.7)
Eosinophils Relative: 1 % (ref 0–5)
HCT: 42.8 % (ref 36.0–46.0)
HEMOGLOBIN: 14 g/dL (ref 12.0–15.0)
LYMPHS ABS: 1.2 10*3/uL (ref 0.7–4.0)
Lymphocytes Relative: 10 % — ABNORMAL LOW (ref 12–46)
MCH: 29.8 pg (ref 26.0–34.0)
MCHC: 32.7 g/dL (ref 30.0–36.0)
MCV: 91.1 fL (ref 78.0–100.0)
MONOS PCT: 9 % (ref 3–12)
Monocytes Absolute: 1.1 10*3/uL — ABNORMAL HIGH (ref 0.1–1.0)
Neutro Abs: 9.9 10*3/uL — ABNORMAL HIGH (ref 1.7–7.7)
Neutrophils Relative %: 81 % — ABNORMAL HIGH (ref 43–77)
Platelets: 185 10*3/uL (ref 150–400)
RBC: 4.7 MIL/uL (ref 3.87–5.11)
RDW: 12.9 % (ref 11.5–15.5)
WBC: 12.2 10*3/uL — ABNORMAL HIGH (ref 4.0–10.5)

## 2013-05-22 LAB — POCT I-STAT, CHEM 8
BUN: 24 mg/dL — AB (ref 6–23)
CHLORIDE: 104 meq/L (ref 96–112)
Calcium, Ion: 1.1 mmol/L — ABNORMAL LOW (ref 1.13–1.30)
Creatinine, Ser: 1.5 mg/dL — ABNORMAL HIGH (ref 0.50–1.10)
Glucose, Bld: 122 mg/dL — ABNORMAL HIGH (ref 70–99)
HEMATOCRIT: 46 % (ref 36.0–46.0)
Hemoglobin: 15.6 g/dL — ABNORMAL HIGH (ref 12.0–15.0)
POTASSIUM: 3.9 meq/L (ref 3.7–5.3)
SODIUM: 139 meq/L (ref 137–147)
TCO2: 24 mmol/L (ref 0–100)

## 2013-05-22 LAB — POCT I-STAT TROPONIN I: TROPONIN I, POC: 0.02 ng/mL (ref 0.00–0.08)

## 2013-05-22 LAB — CG4 I-STAT (LACTIC ACID): Lactic Acid, Venous: 2.69 mmol/L — ABNORMAL HIGH (ref 0.5–2.2)

## 2013-05-22 MED ORDER — ASPIRIN 81 MG PO CHEW
81.0000 mg | CHEWABLE_TABLET | Freq: Every day | ORAL | Status: DC
Start: 2013-05-22 — End: 2013-05-24
  Administered 2013-05-23 – 2013-05-24 (×2): 81 mg via ORAL
  Filled 2013-05-22 (×3): qty 1

## 2013-05-22 MED ORDER — CITALOPRAM HYDROBROMIDE 40 MG PO TABS
40.0000 mg | ORAL_TABLET | Freq: Every day | ORAL | Status: DC
  Administered 2013-05-23 – 2013-05-24 (×3): 40 mg via ORAL
  Filled 2013-05-22 (×3): qty 1

## 2013-05-22 MED ORDER — FAMOTIDINE 20 MG PO TABS
20.0000 mg | ORAL_TABLET | Freq: Every day | ORAL | Status: DC
  Administered 2013-05-23 – 2013-05-24 (×2): 20 mg via ORAL
  Filled 2013-05-22 (×3): qty 1

## 2013-05-22 MED ORDER — SODIUM CHLORIDE 0.9 % IV BOLUS (SEPSIS)
500.0000 mL | Freq: Once | INTRAVENOUS | Status: AC
  Administered 2013-05-22: 500 mL via INTRAVENOUS

## 2013-05-22 MED ORDER — ALUM & MAG HYDROXIDE-SIMETH 200-200-20 MG/5ML PO SUSP: 30.0000 mL | Freq: Four times a day (QID) | ORAL | Status: DC | PRN

## 2013-05-22 MED ORDER — RIVASTIGMINE 9.5 MG/24HR TD PT24
9.5000 mg | MEDICATED_PATCH | Freq: Every day | TRANSDERMAL | Status: DC
  Administered 2013-05-23 – 2013-05-24 (×3): 9.5 mg via TRANSDERMAL
  Filled 2013-05-22 (×3): qty 1

## 2013-05-22 MED ORDER — DOCUSATE SODIUM 100 MG PO CAPS
100.0000 mg | ORAL_CAPSULE | Freq: Two times a day (BID) | ORAL | Status: DC
  Administered 2013-05-23 – 2013-05-24 (×4): 100 mg via ORAL
  Filled 2013-05-22 (×5): qty 1

## 2013-05-22 MED ORDER — ACETAMINOPHEN 650 MG RE SUPP: 650.0000 mg | Freq: Four times a day (QID) | RECTAL | Status: DC | PRN

## 2013-05-22 MED ORDER — HYDROXYZINE HCL 25 MG PO TABS
25.0000 mg | ORAL_TABLET | Freq: Two times a day (BID) | ORAL | Status: DC | PRN
  Filled 2013-05-22 (×2): qty 1

## 2013-05-22 MED ORDER — PANTOPRAZOLE SODIUM 40 MG PO TBEC
40.0000 mg | DELAYED_RELEASE_TABLET | Freq: Every day | ORAL | Status: DC
Start: 2013-05-22 — End: 2013-05-24
  Administered 2013-05-23 – 2013-05-24 (×2): 40 mg via ORAL
  Filled 2013-05-22 (×3): qty 1

## 2013-05-22 MED ORDER — POLYETHYLENE GLYCOL 3350 17 G PO PACK
17.0000 g | PACK | Freq: Every day | ORAL | Status: DC
  Administered 2013-05-23 – 2013-05-24 (×3): 17 g via ORAL
  Filled 2013-05-22 (×3): qty 1

## 2013-05-22 MED ORDER — SODIUM CHLORIDE 0.9 % IV SOLN
INTRAVENOUS | Status: DC
  Administered 2013-05-23: 18:00:00 30 mL/h via INTRAVENOUS
  Administered 2013-05-23: 01:00:00 via INTRAVENOUS

## 2013-05-22 MED ORDER — SODIUM CHLORIDE 0.9 % IJ SOLN
3.0000 mL | Freq: Two times a day (BID) | INTRAMUSCULAR | Status: DC
  Administered 2013-05-24: 10:00:00 3 mL via INTRAVENOUS

## 2013-05-22 MED ORDER — HEPARIN SODIUM (PORCINE) 5000 UNIT/ML IJ SOLN
5000.0000 [IU] | Freq: Three times a day (TID) | INTRAMUSCULAR | Status: DC
  Administered 2013-05-23 (×2): 5000 [IU] via SUBCUTANEOUS
  Filled 2013-05-22 (×8): qty 1

## 2013-05-22 MED ORDER — MELOXICAM 7.5 MG PO TABS
7.5000 mg | ORAL_TABLET | Freq: Every day | ORAL | Status: DC
  Administered 2013-05-23 – 2013-05-24 (×3): 7.5 mg via ORAL
  Filled 2013-05-22 (×3): qty 1

## 2013-05-22 MED ORDER — HYDROXYZINE PAMOATE 25 MG PO CAPS
25.0000 mg | ORAL_CAPSULE | Freq: Two times a day (BID) | ORAL | Status: DC | PRN
  Filled 2013-05-22: qty 1

## 2013-05-22 MED ORDER — HYDROCODONE-ACETAMINOPHEN 5-325 MG PO TABS
1.0000 | ORAL_TABLET | Freq: Two times a day (BID) | ORAL | Status: DC
  Administered 2013-05-23 – 2013-05-24 (×3): 1 via ORAL
  Filled 2013-05-22 (×3): qty 1

## 2013-05-22 MED ORDER — MEMANTINE HCL ER 28 MG PO CP24
28.0000 mg | ORAL_CAPSULE | Freq: Every day | ORAL | Status: DC
  Administered 2013-05-23 – 2013-05-24 (×3): 28 mg via ORAL
  Filled 2013-05-22 (×3): qty 28

## 2013-05-22 MED ORDER — ONDANSETRON HCL 4 MG PO TABS: 4.0000 mg | ORAL_TABLET | Freq: Four times a day (QID) | ORAL | Status: DC | PRN

## 2013-05-22 MED ORDER — ACETAMINOPHEN 325 MG PO TABS: 650.0000 mg | ORAL_TABLET | Freq: Four times a day (QID) | ORAL | Status: DC | PRN

## 2013-05-22 MED ORDER — VITAMIN C 500 MG PO TABS
1000.0000 mg | ORAL_TABLET | Freq: Every day | ORAL | Status: DC
  Administered 2013-05-23 – 2013-05-24 (×3): 1000 mg via ORAL
  Filled 2013-05-22 (×3): qty 2

## 2013-05-22 MED ORDER — ONDANSETRON HCL 4 MG/2ML IJ SOLN
4.0000 mg | Freq: Four times a day (QID) | INTRAMUSCULAR | Status: DC | PRN
  Administered 2013-05-24: 4 mg via INTRAVENOUS
  Filled 2013-05-22: qty 2

## 2013-05-22 NOTE — ED Notes (Signed)
Per EMS pt was at [facility] for a checkup of broken Left femur and she felt weak, Per EMS staff at office reports pt was hypotensive with b/p of 80/50. Per EMS pt is unable to bear weight on Left leg and is wheelchair bound.

## 2013-05-22 NOTE — ED Notes (Signed)
NS bolus completed, see MAR Patient denies urge to void at this time and also adamantly refuses to be cathed for urine Sandwich and drink given to patient Will attempt to get patient to void again when she is finished eating Patient denies further needs or complaints at this time
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
## 2013-05-22 NOTE — ED Notes (Signed)
Bed: WA15 Expected date:  Expected time:  Means of arrival:  Comments: EMS  

## 2013-05-22 NOTE — H&P (Signed)
Kathy Avery is an 80 y.o. female.   Chief Complaint: Near syncope HPI: 80 year old female hypertension, anxiety and Alzheimer had near syncopal episode. She had left femur fracture treated non-operatively in December 2014. She became pale and diaphoretic and hypotensive while sitting up in chair at [facility] for follow up. She lives at [facility] assisted [facility] facility.  Past Medical History  Diagnosis Date  . Hypertension   . Depression   . Anxiety   . Alzheimer disease   . GERD (gastroesophageal reflux disease)   . Hyperlipidemia   . Pelvic fracture   . Clavicular fracture   . Cancer     colon      Past Surgical History  Procedure Laterality Date  . Fracture surgery      pelvis    History reviewed. No pertinent family history. Social History:  reports that she has never smoked. She has never used smokeless tobacco. She reports that she does not drink alcohol or use illicit drugs.  Allergies:  Allergies  Allergen Reactions  . Morphine And Related      (Not in a hospital admission)  Results for orders placed during the hospital encounter of 05/22/13 (from the past 48 hour(s))  CBC WITH DIFFERENTIAL     Status: Abnormal   Collection Time    05/22/13  4:50 PM      Result Value Range   WBC 12.2 (*) 4.0 - 10.5 K/uL   RBC 4.70  3.87 - 5.11 MIL/uL   Hemoglobin 14.0  12.0 - 15.0 g/dL   HCT 42.8  36.0 - 46.0 %   MCV 91.1  78.0 - 100.0 fL   MCH 29.8  26.0 - 34.0 pg   MCHC 32.7  30.0 - 36.0 g/dL   RDW 12.9  11.5 - 15.5 %   Platelets 185  150 - 400 K/uL   Neutrophils Relative % 81 (*) 43 - 77 %   Neutro Abs 9.9 (*) 1.7 - 7.7 K/uL   Lymphocytes Relative 10 (*) 12 - 46 %   Lymphs Abs 1.2  0.7 - 4.0 K/uL   Monocytes Relative 9  3 - 12 %   Monocytes Absolute 1.1 (*) 0.1 - 1.0 K/uL   Eosinophils Relative 1  0 - 5 %   Eosinophils Absolute 0.1  0.0 - 0.7 K/uL   Basophils Relative 0  0 - 1 %   Basophils Absolute 0.0  0.0 - 0.1 K/uL  CG4 I-STAT (LACTIC  ACID)     Status: Abnormal   Collection Time    05/22/13  5:11 PM      Result Value Range   Lactic Acid, Venous 2.69 (*) 0.5 - 2.2 mmol/L  POCT I-STAT, CHEM 8     Status: Abnormal   Collection Time    05/22/13  5:12 PM      Result Value Range   Sodium 139  137 - 147 mEq/L   Potassium 3.9  3.7 - 5.3 mEq/L   Chloride 104  96 - 112 mEq/L   BUN 24 (*) 6 - 23 mg/dL   Creatinine, Ser 1.50 (*) 0.50 - 1.10 mg/dL   Glucose, Bld 122 (*) 70 - 99 mg/dL   Calcium, Ion 1.10 (*) 1.13 - 1.30 mmol/L   TCO2 24  0 - 100 mmol/L   Hemoglobin 15.6 (*) 12.0 - 15.0 g/dL   HCT 46.0  36.0 - 46.0 %  POCT I-STAT TROPONIN I     Status: None  Collection Time    05/22/13  7:20 PM      Result Value Range   Troponin i, poc 0.02  0.00 - 0.08 ng/mL   Comment 3            Comment: Due to the release kinetics of cTnI,     a negative result within the first hours     of the onset of symptoms does not rule out     myocardial infarction with certainty.     If myocardial infarction is still suspected,     repeat the test at appropriate intervals.   Dg Chest Port 1 View  05/22/2013   CLINICAL DATA:  Chest pain and weakness.  EXAM: PORTABLE CHEST - 1 VIEW  COMPARISON:  05/16/2013  FINDINGS: Single view of the chest demonstrates a linear density at the left lung base. The right lung is clear. Heart and mediastinum are stable. Old left clavicle fracture.  IMPRESSION: Linear density at the left lung base has minimally changed since 05/16/2013. Findings could be associated with atelectasis but could also represent a focus of infection or even scar. Recommend follow up to ensure resolution.   Electronically Signed   By: Markus Daft M.D.   On: 05/22/2013 20:37    ROS Constitutional: Positive for diaphoresis. Negative for fever, chills and fatigue.  HENT: Negative for congestion, rhinorrhea and sneezing.  Eyes: Negative.  Respiratory: Positive for shortness of breath. Negative for cough and chest tightness.  Cardiovascular:  Negative for chest pain and leg swelling.  Gastrointestinal: Negative for nausea, vomiting, abdominal pain, diarrhea and blood in stool.  Genitourinary: Negative for frequency, hematuria, flank pain and difficulty urinating.  Musculoskeletal: Negative for arthralgias and back pain.  Skin: Negative for rash.  Neurological: Positive for weakness (generalized). Negative for dizziness, speech difficulty, numbness and headaches.    Blood pressure 113/49, pulse 78, temperature 97.5 F (36.4 C), temperature source Oral, resp. rate 8, SpO2 97.00%.  Physical Exam  Constitutional: She appears well-developed and well-nourished.  HENT: Head: Normocephalic and atraumatic. Mouth/Throat: Oropharynx is clear and moist. Eyes: Pupils are equal, round, and reactive to light.  Neck: Normal range of motion. Neck supple.  Cardiovascular: Normal rate, regular rhythm and normal heart sounds.  Pulmonary/Chest: Effort normal and breath sounds normal. No respiratory distress. She has no wheezes. She has no rales. She exhibits no tenderness.  Abdominal: Soft. Bowel sounds are normal. There is no tenderness. There is no rebound and no guarding.  Musculoskeletal: Normal range of motion. She exhibits no edema. No unilateral leg swelling, obvious calf tenderness  Lymphadenopathy: She has no cervical adenopathy.  Neurological: She is alert and oriented to person, place, and time.  Alert, but confused. At baseline MS per family. Moves all extremities but mild left leg weakness. No facial droop, aphasia.  Skin: Skin is warm and dry. No rash noted.  Psychiatric: She has anxiety..   Assessment/Plan Near syncope Dehydration Left bundle branch block Hypertension Hyperlipidemia S/P Pelvic fracture  Place in observation Tele monitoring Hold blood pressure medications.  Malanie Koloski S 05/22/2013, 9:03 PM

## 2013-05-22 NOTE — ED Notes (Addendum)
Assumed care of patient Patient has yet to void  NS bolus given Will attempt to place patient on bedpan after NS bolus finished

## 2013-05-23 DIAGNOSIS — R55 Syncope and collapse: Secondary | ICD-10-CM | POA: Diagnosis present

## 2013-05-23 DIAGNOSIS — E43 Unspecified severe protein-calorie malnutrition: Secondary | ICD-10-CM | POA: Insufficient documentation

## 2013-05-23 LAB — BASIC METABOLIC PANEL
BUN: 22 mg/dL (ref 6–23)
CO2: 21 meq/L (ref 19–32)
Calcium: 8.2 mg/dL — ABNORMAL LOW (ref 8.4–10.5)
Chloride: 105 mEq/L (ref 96–112)
Creatinine, Ser: 1.13 mg/dL — ABNORMAL HIGH (ref 0.50–1.10)
GFR calc Af Amer: 49 mL/min — ABNORMAL LOW (ref >90)
GFR calc non Af Amer: 42 mL/min — ABNORMAL LOW (ref >90)
GLUCOSE: 91 mg/dL (ref 70–99)
POTASSIUM: 3.8 meq/L (ref 3.7–5.3)
SODIUM: 139 meq/L (ref 137–147)

## 2013-05-23 LAB — URINE MICROSCOPIC-ADD ON

## 2013-05-23 LAB — CBC
HCT: 33.4 % — ABNORMAL LOW (ref 36.0–46.0)
HEMOGLOBIN: 11.1 g/dL — AB (ref 12.0–15.0)
MCH: 30.1 pg (ref 26.0–34.0)
MCHC: 33.2 g/dL (ref 30.0–36.0)
MCV: 90.5 fL (ref 78.0–100.0)
Platelets: 168 10*3/uL (ref 150–400)
RBC: 3.69 MIL/uL — ABNORMAL LOW (ref 3.87–5.11)
RDW: 13 % (ref 11.5–15.5)
WBC: 6.4 10*3/uL (ref 4.0–10.5)

## 2013-05-23 LAB — URINALYSIS, ROUTINE W REFLEX MICROSCOPIC
GLUCOSE, UA: NEGATIVE mg/dL
Ketones, ur: 15 mg/dL — AB
Nitrite: NEGATIVE
PROTEIN: NEGATIVE mg/dL
Specific Gravity, Urine: 1.024 (ref 1.005–1.030)
UROBILINOGEN UA: 0.2 mg/dL (ref 0.0–1.0)
pH: 6 (ref 5.0–8.0)

## 2013-05-23 LAB — PROTIME-INR
INR: 1.12 (ref 0.00–1.49)
Prothrombin Time: 14.2 seconds (ref 11.6–15.2)

## 2013-05-23 LAB — IRON AND TIBC
Iron: 73 ug/dL (ref 42–135)
SATURATION RATIOS: 40 % (ref 20–55)
TIBC: 181 ug/dL — ABNORMAL LOW (ref 250–470)
UIBC: 108 ug/dL — ABNORMAL LOW (ref 125–400)

## 2013-05-23 LAB — FERRITIN: FERRITIN: 479 ng/mL — AB (ref 10–291)

## 2013-05-23 LAB — MRSA PCR SCREENING: MRSA by PCR: NEGATIVE

## 2013-05-23 MED ORDER — ENSURE COMPLETE PO LIQD
237.0000 mL | Freq: Two times a day (BID) | ORAL | Status: DC
  Administered 2013-05-24: 10:00:00 237 mL via ORAL

## 2013-05-23 MED ORDER — CIPROFLOXACIN HCL 250 MG PO TABS
250.0000 mg | ORAL_TABLET | Freq: Two times a day (BID) | ORAL | Status: DC
  Filled 2013-05-23 (×3): qty 1

## 2013-05-23 MED ORDER — CEFUROXIME AXETIL 250 MG PO TABS
250.0000 mg | ORAL_TABLET | Freq: Two times a day (BID) | ORAL | Status: DC
  Administered 2013-05-23 – 2013-05-24 (×3): 250 mg via ORAL
  Filled 2013-05-23 (×6): qty 1

## 2013-05-23 NOTE — Care Management Note (Addendum)
    Page 1 of 1   05/24/2013     3:32:43 PM   CARE MANAGEMENT NOTE 05/24/2013  Patient:  Kathy Avery, Kathy Avery   Account Number:  [id number]  Date Initiated:  05/23/2013  Documentation initiated by:  Dessa Phi  Subjective/Objective Assessment:   80 Y/O F ADMITTED W/SYNCOPE.     Action/Plan:   FROM ALF[facility].HAS PRIVATE SITTERS.   Anticipated DC Date:  05/24/2013   Anticipated DC Plan:  ASSISTED LIVING / [facility]  CM consult      Choice offered to / List presented to:             Status of service:  Completed, signed off Medicare Important Message given?   (If response is "NO", the following Medicare IM given date fields will be blank) Date Medicare IM given:   Date Additional Medicare IM given:    Discharge Disposition:  ASSISTED LIVING  Per UR Regulation:  Reviewed for med. necessity/level of care/duration of stay  If discussed at [place] of Stay Meetings, dates discussed:    Comments:  05/24/13 Mohmed Farver RN,BSN NCM [phone] D/C BACK TO ALF.NO NEEDS OR ORDERS.  05/23/13 Kaelie Henigan RN,BSN NCM [phone]

## 2013-05-23 NOTE — Progress Notes (Signed)
Subjective:  Feeling better. Afebrile. Improving renal function. Iron level of 73 mcg/dL. Sinus rhythm. Preserved LV systolic function with mild posterior wall hypertrophy and diastolic dysfunction.  Objective:  Vital Signs in the last 24 hours: Temp:  [98 F (36.7 C)-98.3 F (36.8 C)] 98 F (36.7 C) (01/06 0615) Pulse Rate:  [71-78] 71 (01/06 0615) Cardiac Rhythm:  [-] Normal sinus rhythm (01/05 2217) Resp:  [8-18] 18 (01/06 0615) BP: (120-140)/(55-81) 128/60 mmHg (01/06 0615) SpO2:  [95 %-98 %] 95 % (01/06 0615) Weight:  [146 lb 9.7 oz (66.5 kg)-148 lb 2.4 oz (67.2 kg)] 148 lb 2.4 oz (67.2 kg) (01/06 0615)  Physical Exam: BP Readings from Last 1 Encounters:  05/23/13 128/60     Wt Readings from Last 1 Encounters:  05/23/13 148 lb 2.4 oz (67.2 kg)    Weight change:   HEENT: [place]/AT, Eyes-Blue, PERL, EOMI, Conjunctiva-Pink, Sclera-Non-icteric Neck: No JVD, No bruit, Trachea midline. Lungs:  Clear, Bilateral. Cardiac:  Regular rhythm, normal S1 and S2, no S3. II/VI systolic murmur. Abdomen:  Soft, non-tender. Extremities:  No edema present. No cyanosis. No clubbing. CNS: AxOx3, Cranial nerves grossly intact, moves all 4 extremities. Right handed. Skin: Warm and dry.   Intake/Output from previous day: 01/05 0701 - 01/06 0700 In: 600 [I.V.:600] Out: 120 [Urine:120]    Lab Results: BMET    Component Value Date/Time   NA 139 05/23/2013 0524   K 3.8 05/23/2013 0524   CL 105 05/23/2013 0524   CO2 21 05/23/2013 0524   GLUCOSE 91 05/23/2013 0524   BUN 22 05/23/2013 0524   CREATININE 1.13* 05/23/2013 0524   CALCIUM 8.2* 05/23/2013 0524   GFRNONAA 42* 05/23/2013 0524   GFRAA 49* 05/23/2013 0524   CBC    Component Value Date/Time   WBC 6.4 05/23/2013 0524   RBC 3.69* 05/23/2013 0524   HGB 11.1* 05/23/2013 0524   HCT 33.4* 05/23/2013 0524   PLT 168 05/23/2013 0524   MCV 90.5 05/23/2013 0524   MCH 30.1 05/23/2013 0524   MCHC 33.2 05/23/2013 0524   RDW 13.0 05/23/2013 0524   LYMPHSABS 1.2 05/22/2013 1650    MONOABS 1.1* 05/22/2013 1650   EOSABS 0.1 05/22/2013 1650   BASOSABS 0.0 05/22/2013 1650   CARDIAC ENZYMES Lab Results  Component Value Date   CKTOTAL 24 10/21/2010   CKMB 2.0 10/21/2010   TROPONINI <0.30 05/16/2013    Scheduled Meds: . aspirin  81 mg Oral Daily  . cefUROXime  250 mg Oral BID WC  . citalopram  40 mg Oral Daily  . docusate sodium  100 mg Oral BID  . famotidine  20 mg Oral Daily  . feeding supplement (ENSURE COMPLETE)  237 mL Oral BID BM  . heparin  5,000 Units Subcutaneous Q8H  . HYDROcodone-acetaminophen  1 tablet Oral BID  . meloxicam  7.5 mg Oral Daily  . Memantine HCl ER  28 mg Oral Daily  . pantoprazole  40 mg Oral Daily  . polyethylene glycol  17 g Oral Daily  . rivastigmine  9.5 mg Transdermal Daily  . sodium chloride  3 mL Intravenous Q12H  . vitamin C  1,000 mg Oral Daily   Continuous Infusions: . sodium chloride 75 mL/hr at 05/23/13 0038   PRN Meds:.acetaminophen, acetaminophen, alum & mag hydroxide-simeth, hydrOXYzine, ondansetron (ZOFRAN) IV, ondansetron  Assessment/Plan: Near syncope  Dehydration  Left bundle branch block  Hypertension  Hyperlipidemia  S/P Pelvic fracture Protein calorie malnutrition, severe Mild anemia of chronic disease/possible blood loss  Decrease IV fluids with increasing oral intake. Discontinue blood pressure medication and accept around 140/90 as her blood pressure. Increase activity. Home soon.   LOS: 1 day    Dixie Dials  MD  05/23/2013, 6:19 PM

## 2013-05-23 NOTE — Progress Notes (Signed)
INITIAL NUTRITION ASSESSMENT  DOCUMENTATION CODES Per approved criteria  -Severe malnutrition in the context of chronic illness  Pt meets criteria for severe MALNUTRITION in the context of chronic disease as evidenced by 8% weight loss in one month and <75% est nutrition needs for > one month  INTERVENTION: -Recommend to modify to Dys3 diet as pt does not have her dentures with her and finds some foods difficult to swallow -Recommend Ensure Complete po BID, each supplement provides 350 kcal and 13 grams of protein   NUTRITION DIAGNOSIS: Unintentional wt loss related to decreased appetite as evidenced by 13 lbs wt loss in one month.   Goal: Pt to meet >/= 90% of their estimated nutrition needs   Monitor:  Total protein/energy intake, swallow profile, labs, weights  Reason for Assessment: Malnutrition Screening Tool  80 y.o. female  Admitting Dx: <principal problem not specified>  ASSESSMENT: 80 year old female hypertension, anxiety and Alzheimer had near syncopal episode. She had left femur fracture treated non-operatively in December 2014. She became pale and diaphoretic and hypotensive while sitting up in chair at [facility] for follow up. She lives at [facility] assisted [facility] facility  -Pt's family reported an unintentional wt loss of 13 lbs in one month period.  -Resides at ALF. Family noted pt has had decreased appetite, does not believe pt is on any supplementation -Pt w/minimal PO intake during admit, <25%. Does not have her dentures with her, and cannot chew tougher meats/food items. Will trial pt on Dys3 diet as pt may tolerate softer foods at this time -Admitted with dehydration. Pt enjoyed Ensure supplement sample during time of assessment. Will recommend to add to assist in hydration and weight stabilization  Height: Ht Readings from Last 1 Encounters:  05/22/13 5\' 6"  (1.676 m)    Weight: Wt Readings from Last 1 Encounters:  05/23/13 148 lb 2.4  oz (67.2 kg)    Ideal Body Weight: 130 lbs  % Ideal Body Weight: 114%  Wt Readings from Last 10 Encounters:  05/23/13 148 lb 2.4 oz (67.2 kg)  04/17/13 140 lb (63.504 kg)  09/18/12 140 lb (63.504 kg)  05/13/12 140 lb (63.504 kg)  09/01/11 140 lb (63.504 kg)  03/07/11 150 lb (68.04 kg)  03/02/11 150 lb (68.04 kg)  01/31/11 150 lb (68.04 kg)  12/12/10 150 lb (68.04 kg)  12/19/09 161 lb (73.029 kg)    Usual Body Weight: 160 lbs  % Usual Body Weight: 92%  BMI:  Body mass index is 23.92 kg/(m^2).  Estimated Nutritional Needs: [id number]  Protein: 60-70 gram Fluid: 2000 ml/daily  Skin: WDL  Diet Order: Dysphagia 3  EDUCATION NEEDS: -No education needs identified at this time   Intake/Output Summary (Last 24 hours) at 05/23/13 1532 Last data filed at 05/23/13 0700  Gross per 24 hour  Intake    600 ml  Output    120 ml  Net    480 ml    Last BM: 1/05   Labs:   Recent Labs Lab 05/22/13 1712 05/23/13 0524  NA 139 139  K 3.9 3.8  CL 104 105  CO2  --  21  BUN 24* 22  CREATININE 1.50* 1.13*  CALCIUM  --  8.2*  GLUCOSE 122* 91    CBG (last 3)  No results found for this basename: GLUCAP,  in the last 72 hours  Scheduled Meds: . aspirin  81 mg Oral Daily  . cefUROXime  250 mg Oral BID WC  . citalopram  40 mg Oral Daily  . docusate sodium  100 mg Oral BID  . famotidine  20 mg Oral Daily  . feeding supplement (ENSURE COMPLETE)  237 mL Oral BID BM  . heparin  5,000 Units Subcutaneous Q8H  . HYDROcodone-acetaminophen  1 tablet Oral BID  . meloxicam  7.5 mg Oral Daily  . Memantine HCl ER  28 mg Oral Daily  . pantoprazole  40 mg Oral Daily  . polyethylene glycol  17 g Oral Daily  . rivastigmine  9.5 mg Transdermal Daily  . sodium chloride  3 mL Intravenous Q12H  . vitamin C  1,000 mg Oral Daily    Continuous Infusions: . sodium chloride 75 mL/hr at 05/23/13 0038    Past Medical History  Diagnosis Date  . Hypertension   . Depression   .  Anxiety   . Alzheimer disease   . GERD (gastroesophageal reflux disease)   . Hyperlipidemia   . Pelvic fracture   . Clavicular fracture   . Cancer     colon    Past Surgical History  Procedure Laterality Date  . Fracture surgery      pelvis    Atlee Abide MS RD LDN Clinical Dietitian [phone]

## 2013-05-23 NOTE — Progress Notes (Signed)
  Echocardiogram 2D Echocardiogram has been performed.  Kathy Avery 05/23/2013, 2:37 PM

## 2013-05-24 LAB — BASIC METABOLIC PANEL
BUN: 16 mg/dL (ref 6–23)
CALCIUM: 8.3 mg/dL — AB (ref 8.4–10.5)
CO2: 21 meq/L (ref 19–32)
CREATININE: 0.84 mg/dL (ref 0.50–1.10)
Chloride: 107 mEq/L (ref 96–112)
GFR calc Af Amer: 70 mL/min — ABNORMAL LOW (ref >90)
GFR calc non Af Amer: 61 mL/min — ABNORMAL LOW (ref >90)
Glucose, Bld: 89 mg/dL (ref 70–99)
Potassium: 4.2 mEq/L (ref 3.7–5.3)
Sodium: 140 mEq/L (ref 137–147)

## 2013-05-24 LAB — CBC
HCT: 35.5 % — ABNORMAL LOW (ref 36.0–46.0)
Hemoglobin: 11.4 g/dL — ABNORMAL LOW (ref 12.0–15.0)
MCH: 29.7 pg (ref 26.0–34.0)
MCHC: 32.1 g/dL (ref 30.0–36.0)
MCV: 92.4 fL (ref 78.0–100.0)
PLATELETS: 150 10*3/uL (ref 150–400)
RBC: 3.84 MIL/uL — ABNORMAL LOW (ref 3.87–5.11)
RDW: 13.2 % (ref 11.5–15.5)
WBC: 6.4 10*3/uL (ref 4.0–10.5)

## 2013-05-24 MED ORDER — ENSURE COMPLETE PO LIQD: 237.0000 mL | ORAL | Status: AC

## 2013-05-24 MED ORDER — HYDROCODONE-ACETAMINOPHEN 5-325 MG PO TABS: 1.0000 | ORAL_TABLET | Freq: Two times a day (BID) | ORAL | Status: DC

## 2013-05-24 MED ORDER — CEFUROXIME AXETIL 250 MG PO TABS: 250.0000 mg | ORAL_TABLET | Freq: Two times a day (BID) | ORAL | Status: DC

## 2013-05-24 NOTE — Discharge Summary (Signed)
Physician Discharge Summary  Patient ID: TSERING LEAMAN MRN: [id number] DOB/AGE: 78/30/1925 78 y.o.  Admit date: 05/22/2013 Discharge date: 05/24/2013  Admission Diagnoses: Near syncope  Dehydration  Left bundle branch block  Hypertension  Hyperlipidemia  S/P Pelvic fracture  Protein calorie malnutrition, severe  Mild anemia of chronic disease/possible blood loss  Discharge Diagnoses:  Principle Problem: * Near syncope * Dehydration -improved UTI Left bundle branch block  Hypertension  Hyperlipidemia  S/P Pelvic fracture  Protein calorie malnutrition, severe  Mild anemia of chronic disease/possible blood loss Anxiety  Discharged Condition: fair  Hospital Course: 80 year old female hypertension, anxiety and Alzheimer had near syncopal episode. She had left femur fracture treated non-operatively in December 2014. She became pale and diaphoretic and hypotensive while sitting up in chair at [facility] for follow up. She lives at [facility]. She was treated with IV fluids and her blood pressure medication was discontinued. Her renal function normalized with hydration. She was able to sit up on her own in bed and was able to transfer to chair with lot of support. She was advised not to walk but sit up and dangle feet as tolerated. Follow up primary care in 2 weeks and repeat chest x-ray per primary care.  Consults: cardiology  Significant Diagnostic Studies: labs: Normal WBC and platelets count. Mild anemia with Hgb of 11.4 but normal iron level.  Elevated BUN/Cr normalized post hydration to 16/0.84. UA showed large leukocyte, culture pending.  CXR: Linear density at the left lung base has minimally changed since 05/16/2013. Findings could be associated with atelectasis but could also represent a focus of infection or even scar. Recommend follow up to ensure resolution.  EKG-SR, LBBB.  Echocardiogram: - Left ventricle: The cavity size was  normal. There was hypertrophy of the posterior wall. Systolic function was normal. The estimated ejection fraction was in the range of 55% to 60%. Wall motion was normal; there were no regional wall motion abnormalities. Doppler parameters are consistent with abnormal left ventricular relaxation (grade 1 diastolic dysfunction). - Pulmonary arteries: PA peak pressure: 66mm Hg (S).  Treatments: IV hydration and antibiotics: Ceftin.  Discharge Exam: Blood pressure 131/68, pulse 85, temperature 97.9 F (36.6 C), temperature source Oral, resp. rate 20, height 5\' 6"  (1.676 m), weight 67 kg (147 lb 11.3 oz), SpO2 96.00%.  HEENT: [place]/AT, Eyes-Blue, PERL, EOMI, Conjunctiva-Pink, Sclera-Non-icteric  Neck: No JVD, No bruit, Trachea midline.  Lungs: Clear, Bilateral.  Cardiac: Regular rhythm, normal S1 and S2, no S3. II/VI systolic murmur.  Abdomen: Soft, non-tender.  Extremities: No edema present. No cyanosis. No clubbing.  CNS: AxOx3, Cranial nerves grossly intact, moves all 4 extremities. Right handed.  Skin: Warm and dry.  Disposition: 03-Skilled Nursing Facility     Medication List    STOP taking these medications       aliskiren 300 MG tablet  Commonly known as:  TEKTURNA      TAKE these medications       aspirin 81 MG chewable tablet  Chew 1 tablet (81 mg total) by mouth daily.     CALCIUM 600/VITAMIN D PO  Take 1 tablet by mouth daily.     cefUROXime 250 MG tablet  Commonly known as:  CEFTIN  Take 1 tablet (250 mg total) by mouth 2 (two) times daily with a meal.     citalopram 40 MG tablet  Commonly known as:  CELEXA  Take 40 mg by mouth daily.     docusate  sodium 100 MG capsule  Commonly known as:  COLACE  Take 100 mg by mouth 2 (two) times daily.     feeding supplement (ENSURE COMPLETE) Liqd  Take 237 mLs by mouth daily.     HYDROcodone-acetaminophen 5-325 MG per tablet  Commonly known as:  NORCO/VICODIN  Take 1 tablet by mouth 2 (two) times daily. Give at 9am and  4pm     hydrOXYzine 25 MG capsule  Commonly known as:  VISTARIL  Take 25 mg by mouth 2 (two) times daily as needed for anxiety.     meloxicam 7.5 MG tablet  Commonly known as:  MOBIC  Take 7.5 mg by mouth daily.     NAMENDA XR 28 MG Cp24  Generic drug:  Memantine HCl ER  Take 28 mg by mouth daily.     omeprazole 20 MG capsule  Commonly known as:  PRILOSEC  Take 20 mg by mouth every morning.     ondansetron 4 MG disintegrating tablet  Commonly known as:  ZOFRAN ODT  Take 1 tablet (4 mg total) by mouth every 8 (eight) hours as needed.     polyethylene glycol packet  Commonly known as:  MIRALAX / GLYCOLAX  Take 17 g by mouth daily.     ranitidine 150 MG capsule  Commonly known as:  ZANTAC  Take 150 mg by mouth every evening.     rivastigmine 9.5 mg/24hr  Commonly known as:  EXELON  Place 1 patch onto the skin daily.     rosuvastatin 10 MG tablet  Commonly known as:  CRESTOR  Take 10 mg by mouth at bedtime.     vitamin C 500 MG tablet  Commonly known as:  ASCORBIC ACID  Take 1,000 mg by mouth daily.     Vitamin D 1000 UNITS capsule  Take 1,000 Units by mouth daily.           Follow-up Information   Follow up with Delphina Cahill, MD. Schedule an appointment as soon as possible for a visit in 2 weeks.   Specialty:  Internal Medicine   Contact information:   [place] [phone]       Signed: Birdie Riddle 05/24/2013, 1:04 PM

## 2013-05-24 NOTE — Progress Notes (Signed)
Patient cleared for discharge. Packet copied and placed in [facility]. [facility] to come pick up. Notified family at bedside. They are agreeable to transfer.  Jsoeph Podesta C. [place] MSW, [place]

## 2013-05-24 NOTE — Progress Notes (Signed)
Clinical Social Work Department BRIEF PSYCHOSOCIAL ASSESSMENT 05/24/2013  Patient:  Kathy Avery, Kathy Avery     Account Number:  [id number]     Admit date:  05/22/2013  Clinical Social Worker:  Venia Minks  Date/Time:  05/24/2013 12:00 M  Referred by:  Physician  Date Referred:  05/24/2013 Referred for  ALF Placement   Other Referral:   Interview type:  Family Other interview type:    PSYCHOSOCIAL DATA Living Status:  FACILITY Admitted from facility:  [facility] Level of care:  Assisted Living Primary support name:  Kathy Avery Primary support relationship to patient:  CHILD, ADULT Degree of support available:   good    CURRENT CONCERNS Current Concerns  Post-Acute Placement   Other Concerns:    SOCIAL WORK ASSESSMENT / PLAN Patient unable to participate in assessment due to mental status. CSW called patient's son, Kathy Avery, he confirms that patient is a long term care resident at [facility] in [place]. He would like patient to return there upon discharge.   Assessment/plan status:   Other assessment/ plan:   Information/referral to community resources:    PATIENT'S/FAMILY'S RESPONSE TO PLAN OF CARE: son states that he has been very happy with patients care at [facility] and would like her to return there upon discharge.

## 2013-05-25 LAB — URINE CULTURE: Colony Count: >=100000

## 2013-07-31 IMAGING — CT CT CERVICAL SPINE W/O CM
4 of 5 series · 15 of 34 positions shown, 17 images · non-contrast
Comparison: Multiple previous studies.

CT HEAD

CLINICAL DATA: Fell.  Hit head.

CT HEAD WITHOUT CONTRAST
CT CERVICAL SPINE WITHOUT CONTRAST
TECHNIQUE: Multidetector CT imaging of the head and cervical spine
was performed following the standard protocol without intravenous
contrast.  Multiplanar CT image reconstructions of the cervical
spine were also generated.

[Series 5: cervical st 2.0 b31s · axial · 0.31mm/px · z∈[+118,+216]mm · 4 of 83 slices shown, 5 images]
[im 17/83  soft-tissue]
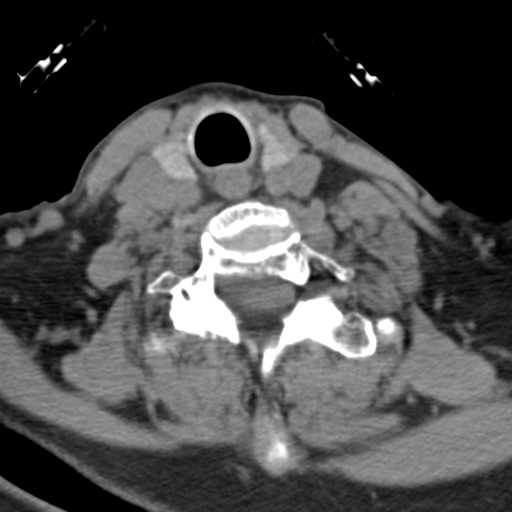
[im 17/83  bone]
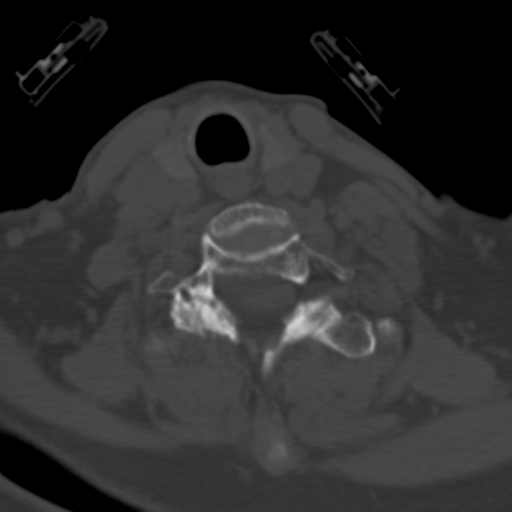
[im 33/83  bone]
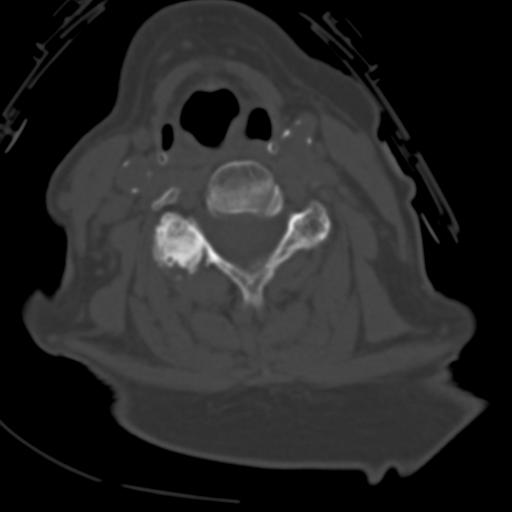
[im 50/83  bone]
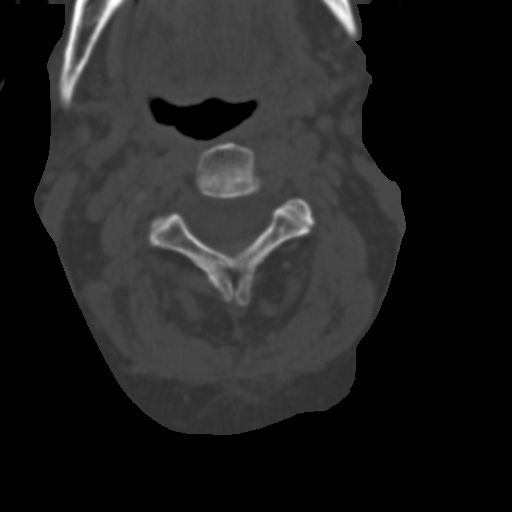
[im 66/83  bone]
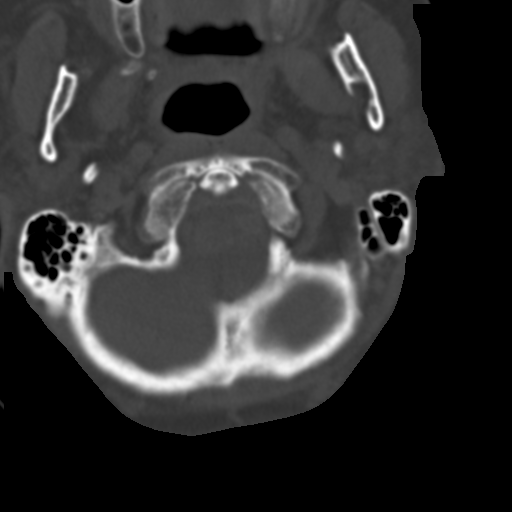

[Series 7: sagittal bone 2.0 · sagittal · 0.24mm/px · 5 of 60 slices shown, 6 images]
[im 20/60  bone]
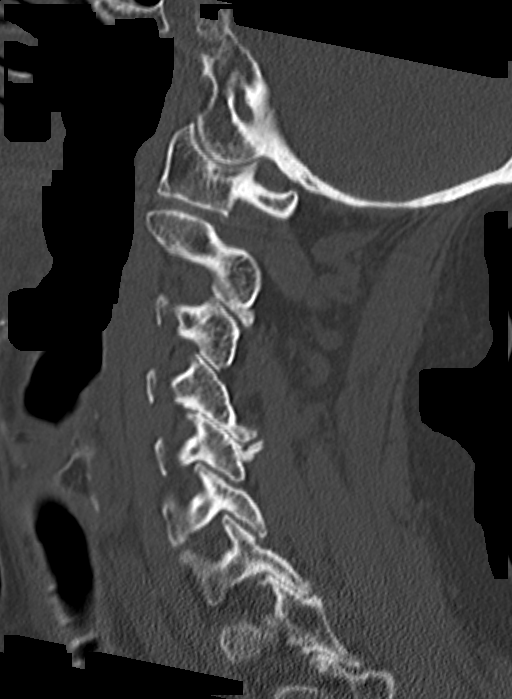
[im 25/60  bone]
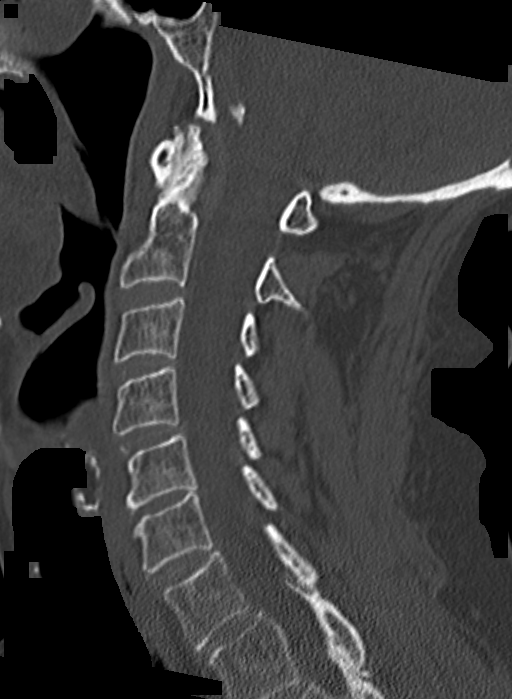
[im 30/60  soft-tissue]
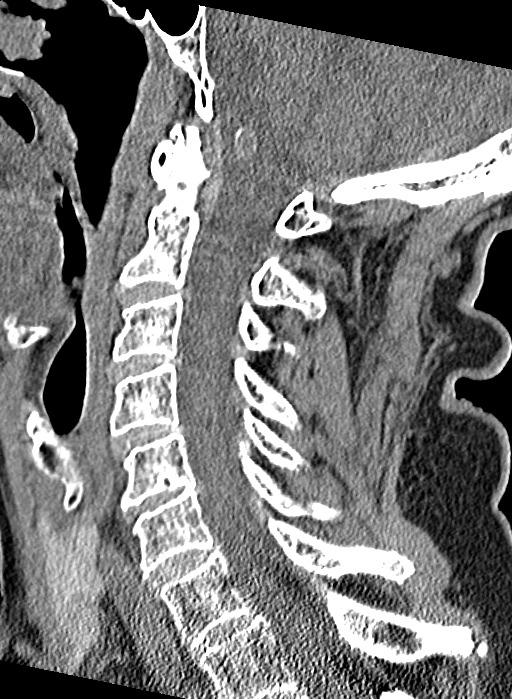
[im 30/60  bone]
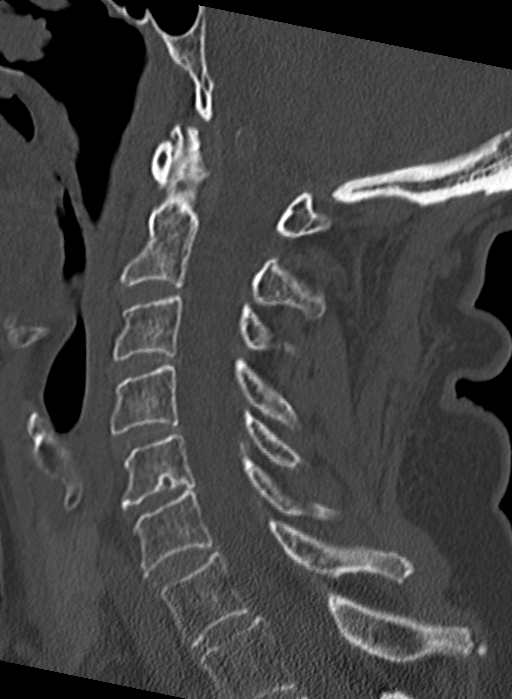
[im 35/60  bone]
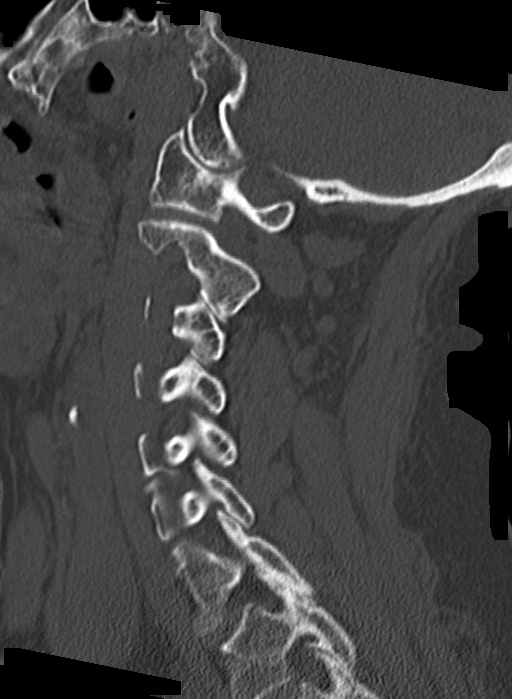
[im 40/60  bone]
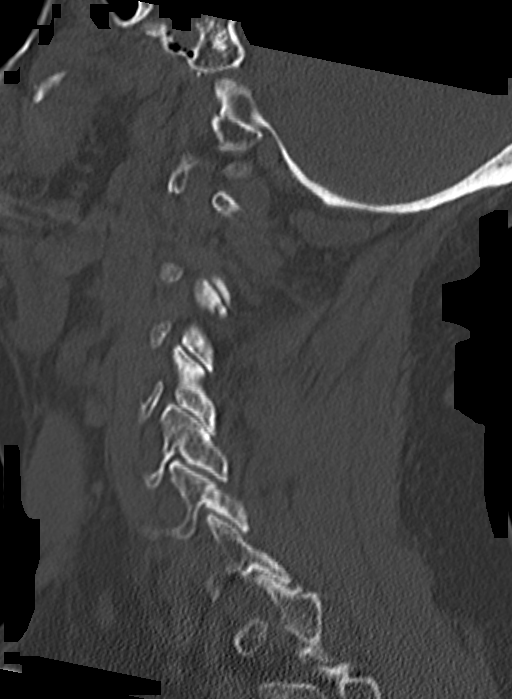

[Series 8: coronal bone 2.0 · coronal · 0.34mm/px · 3 of 53 slices shown]
[im 11/53  bone]
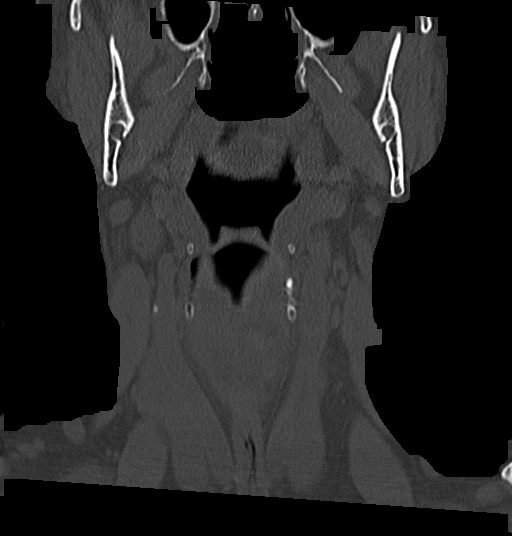
[im 21/53  bone]
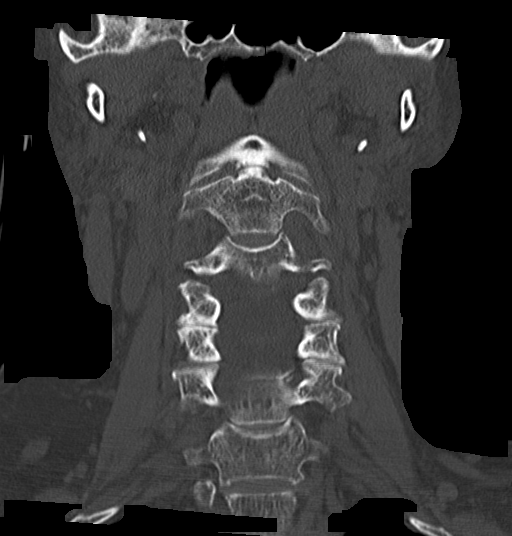
[im 32/53  bone]
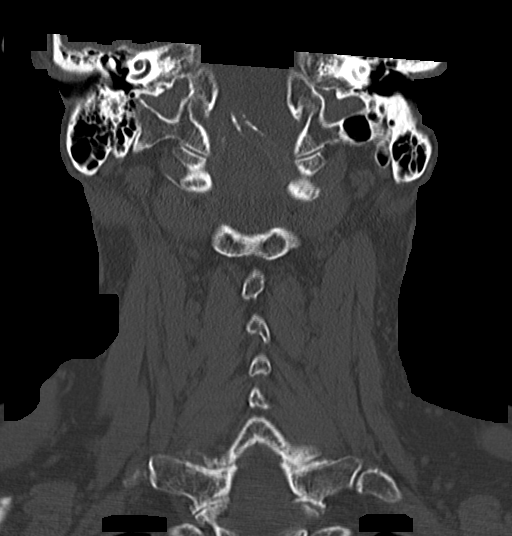

[Series 9: axial bone 2.0 · axial · 0.21mm/px · z∈[+89,+154]mm · 3 of 88 slices shown]
[im 18/88  bone]
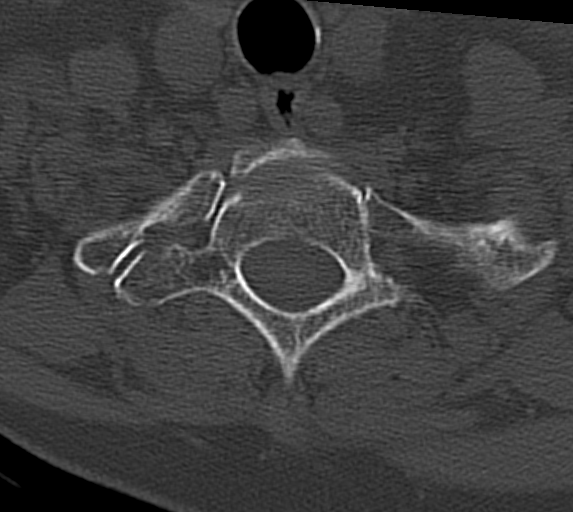
[im 35/88  bone]
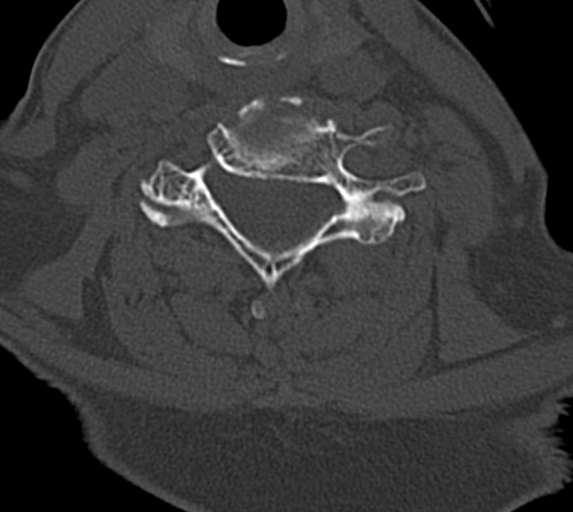
[im 53/88  bone]
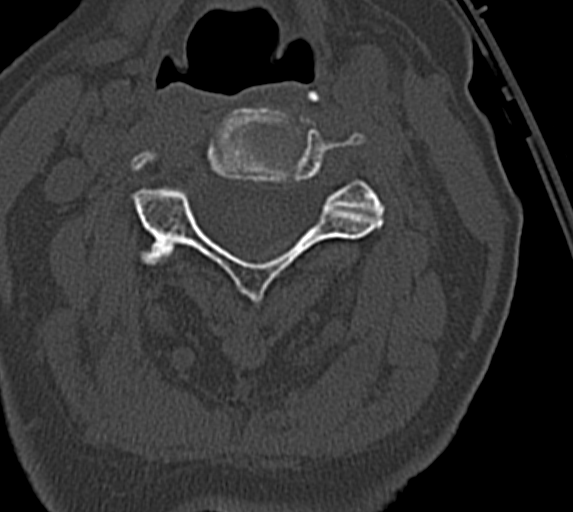

[15 of 34 positions shown; findings below may reference images not displayed]

FINDINGS: Stable age related cerebral atrophy, ventriculomegaly and
periventricular white matter disease.  Remote lacunar type basal
ganglia infarcts are noted.  No extra-axial fluid collections.  No
CT findings for acute hemispheric infarction and/or intracranial
hemorrhage.  No mass lesions.  The brainstem and cerebellum grossly
normal and stable.

No acute skull fracture.  The paranasal sinuses and mastoid air
cells are clear.  The globes are intact.
IMPRESSION: 1.  Stable age related cerebral atrophy, ventriculomegaly and
periventricular white matter disease.
2.  No acute intracranial findings or skull fracture.

CT CERVICAL SPINE
FINDINGS: Stable alignment of the cervical vertebral bodies.  No
acute fracture or abnormal prevertebral soft tissue swelling.  The
facets are normally aligned.  Stable facet disease.  No facet or
laminar fractures.  The skull base C1 and C1-2 articulations are
maintained.  Stable degenerative changes at C1-2.
IMPRESSION: Normal alignment and no acute bony findings.

## 2014-08-03 ENCOUNTER — Encounter (HOSPITAL_COMMUNITY): Payer: Self-pay

## 2014-08-03 ENCOUNTER — Emergency Department (HOSPITAL_COMMUNITY)
Admission: EM | Admit: 2014-08-03 | Discharge: 2014-08-03 | Disposition: A | Discharge status: Home / Self Care | Payer: Medicare Other | Attending: Emergency Medicine | Admitting: Emergency Medicine

## 2014-08-03 DIAGNOSIS — F329 Major depressive disorder, single episode, unspecified: Secondary | ICD-10-CM | POA: Diagnosis not present

## 2014-08-03 DIAGNOSIS — N390 Urinary tract infection, site not specified: Secondary | ICD-10-CM | POA: Diagnosis not present

## 2014-08-03 DIAGNOSIS — K219 Gastro-esophageal reflux disease without esophagitis: Secondary | ICD-10-CM | POA: Diagnosis not present

## 2014-08-03 DIAGNOSIS — Z85038 Personal history of other malignant neoplasm of large intestine: Secondary | ICD-10-CM | POA: Diagnosis not present

## 2014-08-03 DIAGNOSIS — Z7982 Long term (current) use of aspirin: Secondary | ICD-10-CM | POA: Insufficient documentation

## 2014-08-03 DIAGNOSIS — Z8781 Personal history of (healed) traumatic fracture: Secondary | ICD-10-CM | POA: Insufficient documentation

## 2014-08-03 DIAGNOSIS — I10 Essential (primary) hypertension: Secondary | ICD-10-CM | POA: Diagnosis not present

## 2014-08-03 DIAGNOSIS — Z79899 Other long term (current) drug therapy: Secondary | ICD-10-CM | POA: Insufficient documentation

## 2014-08-03 DIAGNOSIS — Z791 Long term (current) use of non-steroidal anti-inflammatories (NSAID): Secondary | ICD-10-CM | POA: Diagnosis not present

## 2014-08-03 DIAGNOSIS — G309 Alzheimer's disease, unspecified: Secondary | ICD-10-CM | POA: Insufficient documentation

## 2014-08-03 DIAGNOSIS — E785 Hyperlipidemia, unspecified: Secondary | ICD-10-CM | POA: Diagnosis not present

## 2014-08-03 DIAGNOSIS — F419 Anxiety disorder, unspecified: Secondary | ICD-10-CM | POA: Diagnosis not present

## 2014-08-03 DIAGNOSIS — F0280 Dementia in other diseases classified elsewhere without behavioral disturbance: Secondary | ICD-10-CM | POA: Diagnosis not present

## 2014-08-03 DIAGNOSIS — R509 Fever, unspecified: Secondary | ICD-10-CM | POA: Diagnosis present

## 2014-08-03 LAB — URINALYSIS, ROUTINE W REFLEX MICROSCOPIC
Bilirubin Urine: NEGATIVE
GLUCOSE, UA: NEGATIVE mg/dL
KETONES UR: NEGATIVE mg/dL
Nitrite: POSITIVE — AB
Protein, ur: 30 mg/dL — AB
Specific Gravity, Urine: 1.02 (ref 1.005–1.030)
Urobilinogen, UA: 0.2 mg/dL (ref 0.0–1.0)
pH: 7 (ref 5.0–8.0)

## 2014-08-03 LAB — URINE MICROSCOPIC-ADD ON

## 2014-08-03 MED ORDER — CIPROFLOXACIN HCL 500 MG PO TABS: 500.0000 mg | ORAL_TABLET | Freq: Two times a day (BID) | ORAL | Status: DC

## 2014-08-03 MED ORDER — CIPROFLOXACIN HCL 250 MG PO TABS
500.0000 mg | ORAL_TABLET | Freq: Once | ORAL | Status: AC
  Administered 2014-08-03: 500 mg via ORAL
  Filled 2014-08-03: qty 2

## 2014-08-03 NOTE — Discharge Instructions (Signed)
Urine sample shows evidence of infection. Antibiotic twice a day for 7 days. Increase fluids.

## 2014-08-03 NOTE — ED Notes (Signed)
Patient from [facility], staff c/o of strong urine smell. Hx of dementia,  mentation is at baseline.

## 2014-08-03 NOTE — ED Notes (Signed)
Spoke with "nurse" from facility, she was upset that patient was not being admitted for IV antibiotic therapy, explained rationale to her.

## 2014-08-03 NOTE — ED Provider Notes (Signed)
CSN: [id number]     Arrival date & time 08/03/14  2007 History   First MD Initiated Contact with Patient 08/03/14 2021     Chief Complaint  Patient presents with  . Urinary Tract Infection     (Consider location/radiation/quality/duration/timing/severity/associated sxs/prior Treatment) HPI.... Level V caveat for dementia.  Patient lives in memory care unit at local facility. Chief complaint is fever, malodorous urine, decreased appetite. In the past this has heralded a urinary tract infection. Family reports her mental status is baseline.  Past Medical History  Diagnosis Date  . Hypertension   . Depression   . Anxiety   . Alzheimer disease   . GERD (gastroesophageal reflux disease)   . Hyperlipidemia   . Pelvic fracture   . Clavicular fracture   . Cancer     colon   Past Surgical History  Procedure Laterality Date  . Fracture surgery      pelvis   History reviewed. No pertinent family history. History  Substance Use Topics  . Smoking status: Never Smoker   . Smokeless tobacco: Never Used  . Alcohol Use: No   OB History    No data available     Review of Systems  Unable to perform ROS: Dementia      Allergies  Morphine and related  Home Medications   Prior to Admission medications   Medication Sig Start Date End Date Taking? Authorizing Provider  acetaminophen (TYLENOL) 500 MG tablet Take 1,000 mg by mouth every 8 (eight) hours as needed for mild pain.   Yes Historical Provider, MD  aspirin 81 MG chewable tablet Chew 1 tablet (81 mg total) by mouth daily. 04/17/13  Yes Teressa Lower, MD  Calcium Carbonate-Vitamin D (CALCIUM 600/VITAMIN D PO) Take 1 tablet by mouth daily.     Yes Historical Provider, MD  Cholecalciferol (VITAMIN D) 1000 UNITS capsule Take 1,000 Units by mouth daily.     Yes Historical Provider, MD  citalopram (CELEXA) 40 MG tablet Take 40 mg by mouth daily.    Yes Historical Provider, MD  docusate sodium (COLACE) 100 MG capsule Take 100 mg by  mouth 2 (two) times daily.   Yes Historical Provider, MD  feeding supplement, ENSURE COMPLETE, (ENSURE COMPLETE) LIQD Take 237 mLs by mouth daily. 05/24/13  Yes Dixie Dials, MD  LORazepam (ATIVAN) 0.5 MG tablet Take 0.5 mg by mouth 2 (two) times daily.   Yes Historical Provider, MD  meloxicam (MOBIC) 7.5 MG tablet Take 7.5 mg by mouth daily.   Yes Historical Provider, MD  Memantine HCl ER (NAMENDA XR) 28 MG CP24 Take 28 mg by mouth daily.   Yes Historical Provider, MD  omeprazole (PRILOSEC) 20 MG capsule Take 20 mg by mouth every morning.    Yes Historical Provider, MD  polyethylene glycol (MIRALAX / GLYCOLAX) packet Take 17 g by mouth daily.     Yes Historical Provider, MD  ranitidine (ZANTAC) 150 MG capsule Take 150 mg by mouth every evening.     Yes Historical Provider, MD  rivastigmine (EXELON) 9.5 mg/24hr Place 1 patch onto the skin daily.     Yes Historical Provider, MD  rosuvastatin (CRESTOR) 10 MG tablet Take 10 mg by mouth at bedtime.    Yes Historical Provider, MD  traMADol (ULTRAM) 50 MG tablet Take 50 mg by mouth every 6 (six) hours as needed for moderate pain.   Yes Historical Provider, MD  vitamin C (ASCORBIC ACID) 500 MG tablet Take 1,000 mg by mouth daily.  Yes Historical Provider, MD  cefUROXime (CEFTIN) 250 MG tablet Take 1 tablet (250 mg total) by mouth 2 (two) times daily with a meal. Patient not taking: Reported on 08/03/2014 05/24/13   Dixie Dials, MD  ciprofloxacin (CIPRO) 500 MG tablet Take 1 tablet (500 mg total) by mouth 2 (two) times daily. One po bid x 7 days 08/03/14   Nat Christen, MD  HYDROcodone-acetaminophen (NORCO/VICODIN) 5-325 MG per tablet Take 1 tablet by mouth 2 (two) times daily. Give at 9am and 4pm Patient not taking: Reported on 08/03/2014 05/24/13   Dixie Dials, MD  ondansetron (ZOFRAN ODT) 4 MG disintegrating tablet Take 1 tablet (4 mg total) by mouth every 8 (eight) hours as needed. Patient not taking: Reported on 08/03/2014 05/16/13   Fredia Sorrow, MD    BP 168/97 mmHg  Pulse 100  Temp(Src) 100.8 F (38.2 C)  Resp 16  Ht 5\' 6"  (1.676 m)  Wt 160 lb (72.576 kg)  BMI 25.84 kg/m2  SpO2 100% Physical Exam  Constitutional:  Demented, nontoxic  HENT:  Head: Normocephalic and atraumatic.  Eyes: Conjunctivae and EOM are normal. Pupils are equal, round, and reactive to light.  Neck: Normal range of motion. Neck supple.  Cardiovascular: Normal rate and regular rhythm.   Pulmonary/Chest: Effort normal and breath sounds normal.  Abdominal: Soft. Bowel sounds are normal.  Musculoskeletal: Normal range of motion.  Neurological:  Moves arms and legs.  Skin: Skin is warm and dry.  Psychiatric:  Demented  Nursing note and vitals reviewed.   ED Course  Procedures (including critical care time) Labs Review Labs Reviewed  URINALYSIS, ROUTINE W REFLEX MICROSCOPIC - Abnormal; Notable for the following:    APPearance HAZY (*)    Hgb urine dipstick LARGE (*)    Protein, ur 30 (*)    Nitrite POSITIVE (*)    Leukocytes, UA LARGE (*)    All other components within normal limits  URINE MICROSCOPIC-ADD ON - Abnormal; Notable for the following:    Bacteria, UA MANY (*)    All other components within normal limits  URINE CULTURE    Imaging Review No results found.   EKG Interpretation None      MDM   Final diagnoses:  UTI (lower urinary tract infection)    Patient is nontoxic-appearing. Urinalysis shows evidence of infection. Start Cipro. Urine culture. Family does not want any other acute interventions. She can be treated as an outpatient at her facility.    Nat Christen, MD 08/03/14 2209

## 2014-08-07 LAB — URINE CULTURE: Colony Count: >=100000

## 2014-08-09 ENCOUNTER — Telehealth: Payer: Self-pay | Admitting: *Deleted

## 2014-08-09 NOTE — ED Notes (Signed)
(+)  urine culture, treated with Ciprofloxacin, OK per J. Judie Bonus, Pharm

## 2015-01-27 ENCOUNTER — Encounter (HOSPITAL_COMMUNITY): Payer: Self-pay | Admitting: Emergency Medicine

## 2015-01-27 ENCOUNTER — Emergency Department (HOSPITAL_COMMUNITY)
Admission: EM | Admit: 2015-01-27 | Discharge: 2015-01-27 | Disposition: A | Discharge status: Home / Self Care | Payer: Medicare Other | Attending: Emergency Medicine | Admitting: Emergency Medicine

## 2015-01-27 DIAGNOSIS — Z85038 Personal history of other malignant neoplasm of large intestine: Secondary | ICD-10-CM | POA: Diagnosis not present

## 2015-01-27 DIAGNOSIS — F329 Major depressive disorder, single episode, unspecified: Secondary | ICD-10-CM | POA: Diagnosis not present

## 2015-01-27 DIAGNOSIS — Z7982 Long term (current) use of aspirin: Secondary | ICD-10-CM | POA: Insufficient documentation

## 2015-01-27 DIAGNOSIS — Z87828 Personal history of other (healed) physical injury and trauma: Secondary | ICD-10-CM | POA: Insufficient documentation

## 2015-01-27 DIAGNOSIS — Z79899 Other long term (current) drug therapy: Secondary | ICD-10-CM | POA: Diagnosis not present

## 2015-01-27 DIAGNOSIS — G309 Alzheimer's disease, unspecified: Secondary | ICD-10-CM | POA: Insufficient documentation

## 2015-01-27 DIAGNOSIS — I10 Essential (primary) hypertension: Secondary | ICD-10-CM | POA: Diagnosis not present

## 2015-01-27 DIAGNOSIS — F419 Anxiety disorder, unspecified: Secondary | ICD-10-CM | POA: Diagnosis not present

## 2015-01-27 DIAGNOSIS — R197 Diarrhea, unspecified: Secondary | ICD-10-CM | POA: Insufficient documentation

## 2015-01-27 DIAGNOSIS — F0280 Dementia in other diseases classified elsewhere without behavioral disturbance: Secondary | ICD-10-CM | POA: Diagnosis not present

## 2015-01-27 DIAGNOSIS — R195 Other fecal abnormalities: Secondary | ICD-10-CM | POA: Insufficient documentation

## 2015-01-27 DIAGNOSIS — Z791 Long term (current) use of non-steroidal anti-inflammatories (NSAID): Secondary | ICD-10-CM | POA: Diagnosis not present

## 2015-01-27 DIAGNOSIS — K219 Gastro-esophageal reflux disease without esophagitis: Secondary | ICD-10-CM | POA: Diagnosis not present

## 2015-01-27 DIAGNOSIS — K625 Hemorrhage of anus and rectum: Secondary | ICD-10-CM | POA: Diagnosis present

## 2015-01-27 DIAGNOSIS — K921 Melena: Secondary | ICD-10-CM

## 2015-01-27 DIAGNOSIS — N390 Urinary tract infection, site not specified: Secondary | ICD-10-CM

## 2015-01-27 LAB — URINE MICROSCOPIC-ADD ON

## 2015-01-27 LAB — CBC WITH DIFFERENTIAL/PLATELET
BASOS ABS: 0 10*3/uL (ref 0.0–0.1)
BASOS PCT: 0 % (ref 0–1)
EOS ABS: 0 10*3/uL (ref 0.0–0.7)
Eosinophils Relative: 0 % (ref 0–5)
HCT: 41.2 % (ref 36.0–46.0)
HEMOGLOBIN: 13.3 g/dL (ref 12.0–15.0)
Lymphocytes Relative: 9 % — ABNORMAL LOW (ref 12–46)
Lymphs Abs: 0.7 10*3/uL (ref 0.7–4.0)
MCH: 30.6 pg (ref 26.0–34.0)
MCHC: 32.3 g/dL (ref 30.0–36.0)
MCV: 94.7 fL (ref 78.0–100.0)
Monocytes Absolute: 0.6 10*3/uL (ref 0.1–1.0)
Monocytes Relative: 8 % (ref 3–12)
NEUTROS PCT: 83 % — AB (ref 43–77)
Neutro Abs: 6.7 10*3/uL (ref 1.7–7.7)
Platelets: 137 10*3/uL — ABNORMAL LOW (ref 150–400)
RBC: 4.35 MIL/uL (ref 3.87–5.11)
RDW: 13.6 % (ref 11.5–15.5)
WBC: 8 10*3/uL (ref 4.0–10.5)

## 2015-01-27 LAB — BASIC METABOLIC PANEL
Anion gap: 9 (ref 5–15)
BUN: 26 mg/dL — ABNORMAL HIGH (ref 6–20)
CO2: 25 mmol/L (ref 22–32)
CREATININE: 1.25 mg/dL — AB (ref 0.44–1.00)
Calcium: 8.6 mg/dL — ABNORMAL LOW (ref 8.9–10.3)
Chloride: 107 mmol/L (ref 101–111)
GFR calc Af Amer: 43 mL/min — ABNORMAL LOW (ref >60)
GFR calc non Af Amer: 37 mL/min — ABNORMAL LOW (ref >60)
Glucose, Bld: 112 mg/dL — ABNORMAL HIGH (ref 65–99)
POTASSIUM: 4.1 mmol/L (ref 3.5–5.1)
SODIUM: 141 mmol/L (ref 135–145)

## 2015-01-27 LAB — URINALYSIS, ROUTINE W REFLEX MICROSCOPIC
Bilirubin Urine: NEGATIVE
GLUCOSE, UA: NEGATIVE mg/dL
Ketones, ur: 15 mg/dL — AB
Nitrite: NEGATIVE
Protein, ur: NEGATIVE mg/dL
Specific Gravity, Urine: 1.02 (ref 1.005–1.030)
Urobilinogen, UA: 0.2 mg/dL (ref 0.0–1.0)
pH: 7.5 (ref 5.0–8.0)

## 2015-01-27 LAB — POC OCCULT BLOOD, ED: Fecal Occult Bld: POSITIVE — AB

## 2015-01-27 MED ORDER — ONDANSETRON 4 MG PO TBDP: 4.0000 mg | ORAL_TABLET | Freq: Three times a day (TID) | ORAL | Status: AC | PRN

## 2015-01-27 MED ORDER — ONDANSETRON HCL 4 MG/2ML IJ SOLN
4.0000 mg | Freq: Once | INTRAMUSCULAR | Status: AC | PRN
  Administered 2015-01-27: 4 mg via INTRAVENOUS
  Filled 2015-01-27: qty 2

## 2015-01-27 MED ORDER — SULFAMETHOXAZOLE-TRIMETHOPRIM 800-160 MG PO TABS: 1.0000 | ORAL_TABLET | Freq: Two times a day (BID) | ORAL | Status: AC

## 2015-01-27 NOTE — Discharge Instructions (Signed)
1. Medications: bactrim, usual home medications 2. Treatment: rest, drink plenty of fluids 3. Follow Up: please followup with your primary doctor for discussion of your diagnoses and further evaluation after today's visit; please return to the ER for new or worsening symptoms   Urinary Tract Infection A urinary tract infection (UTI) can occur any place along the urinary tract. The tract includes the kidneys, ureters, bladder, and urethra. A type of germ called bacteria often causes a UTI. UTIs are often helped with antibiotic medicine.  HOME CARE   If given, take antibiotics as told by your doctor. Finish them even if you start to feel better.  Drink enough fluids to keep your pee (urine) clear or pale yellow.  Avoid tea, drinks with caffeine, and bubbly (carbonated) drinks.  Pee often. Avoid holding your pee in for a long time.  Pee before and after having sex (intercourse).  Wipe from front to back after you poop (bowel movement) if you are a woman. Use each tissue only once. GET HELP RIGHT AWAY IF:   You have back pain.  You have lower belly (abdominal) pain.  You have chills.  You feel sick to your stomach (nauseous).  You throw up (vomit).  Your burning or discomfort with peeing does not go away.  You have a fever.  Your symptoms are not better in 3 days. MAKE SURE YOU:   Understand these instructions.  Will watch your condition.  Will get help right away if you are not doing well or get worse. Document Released: 10/21/2007 Document Revised: 01/27/2012 Document Reviewed: 12/03/2011 [facility] Patient Information 2015 [place], [place]. This information is not intended to replace advice given to you by your health care provider. Make sure you discuss any questions you have with your health care provider.

## 2015-01-27 NOTE — ED Notes (Signed)
Pts son notified of wait for [facility].  Pt son states that he will take Pt back himself.

## 2015-01-27 NOTE — ED Notes (Signed)
Pt has had UTI and taking oral antibiotics (Cipro). She began refusing all meds for the last 2 days and has not been eating either.  Pt is a DNR (bedside)with Hx of dementia and alzheimers.  Pt is from [facility] in [place].  PT c/o diarrhea X1 today and caregiver saw that a large amount of frank blood was present in the toilet. Pt vomitted a few times PTA to [facility] ED.  C/o pain only when she is ready to vomit.

## 2015-01-27 NOTE — ED Notes (Signed)
Bed: WA21 Expected date: 01/27/15 Expected time: 12:19 PM Means of arrival: Ambulance Comments: Bloody stools

## 2015-01-27 NOTE — ED Provider Notes (Signed)
CSN: [id number]     Arrival date & time 01/27/15  1215 History   First MD Initiated Contact with Patient 01/27/15 1304     Chief Complaint  Patient presents with  . Rectal Bleeding  . Emesis    HPI   Kathy Avery is a 81 y.o. female with a PMH of HTN, HLD, depression, anxiety, alzheimer disease who presents to the ED with diarrhea. Per report, caregiver noted that patient has one episode of diarrhea this morning with a large amount of blood in her stool. Patient's son is present at bedside, who reports she has had a decreased appetite over the past 1-2 weeks and has been complaining of nausea. He states today is the best he has seen her in the last month. Patient reports nausea, otherwise has no complaints in the ED. Of note, patient is currently being treated for a UTI with PO antibiotics.    Past Medical History  Diagnosis Date  . Hypertension   . Depression   . Anxiety   . Alzheimer disease   . GERD (gastroesophageal reflux disease)   . Hyperlipidemia   . Pelvic fracture   . Clavicular fracture   . Cancer     colon   Past Surgical History  Procedure Laterality Date  . Fracture surgery      pelvis   History reviewed. No pertinent family history. Social History  Substance Use Topics  . Smoking status: Never Smoker   . Smokeless tobacco: Never Used  . Alcohol Use: No   OB History    No data available      Review of Systems  Constitutional: Negative for fever, chills, activity change, appetite change and fatigue.  Respiratory: Negative for shortness of breath.   Cardiovascular: Negative for chest pain, palpitations and leg swelling.  Gastrointestinal: Positive for nausea and diarrhea. Negative for vomiting, abdominal pain, constipation and abdominal distention.  Genitourinary: Negative for dysuria, urgency and frequency.  Musculoskeletal: Negative for myalgias, back pain, arthralgias, neck pain and neck stiffness.  Skin: Negative for color change, pallor, rash and  wound.  Neurological: Negative for dizziness, syncope, weakness, light-headedness, numbness and headaches.  All other systems reviewed and are negative.     Allergies  Morphine and related  Home Medications   Prior to Admission medications   Medication Sig Start Date End Date Taking? Authorizing Provider  aspirin 81 MG chewable tablet Chew 1 tablet (81 mg total) by mouth daily. 04/17/13  Yes Teressa Lower, MD  Calcium Carbonate-Vitamin D (CALCIUM 600/VITAMIN D PO) Take 1 tablet by mouth daily.     Yes Historical Provider, MD  Cholecalciferol (VITAMIN D) 1000 UNITS capsule Take 1,000 Units by mouth daily.     Yes Historical Provider, MD  citalopram (CELEXA) 40 MG tablet Take 40 mg by mouth daily.    Yes Historical Provider, MD  docusate sodium (COLACE) 100 MG capsule Take 100 mg by mouth 2 (two) times daily.   Yes Historical Provider, MD  feeding supplement, ENSURE COMPLETE, (ENSURE COMPLETE) LIQD Take 237 mLs by mouth daily. Patient taking differently: Take 237 mLs by mouth 2 (two) times daily. Strawberry 05/24/13  Yes Dixie Dials, MD  LORazepam (ATIVAN) 0.5 MG tablet Take 0.5 mg by mouth 2 (two) times daily.   Yes Historical Provider, MD  meloxicam (MOBIC) 7.5 MG tablet Take 7.5 mg by mouth daily.   Yes Historical Provider, MD  memantine (NAMENDA) 10 MG tablet Take 10 mg by mouth 2 (two) times daily.  Yes Historical Provider, MD  omeprazole (PRILOSEC) 20 MG capsule Take 20 mg by mouth every morning.    Yes Historical Provider, MD  polyethylene glycol (MIRALAX / GLYCOLAX) packet Take 17 g by mouth daily.     Yes Historical Provider, MD  ranitidine (ZANTAC) 150 MG capsule Take 150 mg by mouth every evening.     Yes Historical Provider, MD  rivastigmine (EXELON) 9.5 mg/24hr Place 1 patch onto the skin daily.     Yes Historical Provider, MD  rosuvastatin (CRESTOR) 10 MG tablet Take 10 mg by mouth at bedtime.    Yes Historical Provider, MD  sulfamethoxazole-trimethoprim (BACTRIM DS,SEPTRA DS)  800-160 MG per tablet Take 1 tablet by mouth 2 (two) times daily. 01/24/15 01/31/15 Yes Historical Provider, MD  vitamin C (ASCORBIC ACID) 500 MG tablet Take 1,000 mg by mouth daily.    Yes Historical Provider, MD  acetaminophen (TYLENOL) 500 MG tablet Take 1,000 mg by mouth every 8 (eight) hours as needed for mild pain.    Historical Provider, MD  cefUROXime (CEFTIN) 250 MG tablet Take 1 tablet (250 mg total) by mouth 2 (two) times daily with a meal. Patient not taking: Reported on 08/03/2014 05/24/13   Dixie Dials, MD  ciprofloxacin (CIPRO) 500 MG tablet Take 1 tablet (500 mg total) by mouth 2 (two) times daily. One po bid x 7 days Patient not taking: Reported on 01/27/2015 08/03/14   Nat Christen, MD  HYDROcodone-acetaminophen (NORCO/VICODIN) 5-325 MG per tablet Take 1 tablet by mouth 2 (two) times daily. Give at 9am and 4pm Patient not taking: Reported on 08/03/2014 05/24/13   Dixie Dials, MD  ondansetron (ZOFRAN ODT) 4 MG disintegrating tablet Take 1 tablet (4 mg total) by mouth every 8 (eight) hours as needed. Patient not taking: Reported on 08/03/2014 05/16/13   Fredia Sorrow, MD    BP 194/83 mmHg  Pulse 80  Temp(Src) 98.2 F (36.8 C) (Oral)  Resp 16  SpO2 95% Physical Exam  Constitutional: She appears well-developed and well-nourished. No distress.  HENT:  Head: Normocephalic and atraumatic.  Right Ear: External ear normal.  Left Ear: External ear normal.  Nose: Nose normal.  Mouth/Throat: Uvula is midline, oropharynx is clear and moist and mucous membranes are normal.  Eyes: Conjunctivae, EOM and lids are normal. Pupils are equal, round, and reactive to light. Right eye exhibits no discharge. Left eye exhibits no discharge. No scleral icterus.  Neck: Normal range of motion. Neck supple.  Cardiovascular: Normal rate, regular rhythm, normal heart sounds, intact distal pulses and normal pulses.   Pulmonary/Chest: Effort normal and breath sounds normal. No respiratory distress. She has no  wheezes. She has no rales.  Abdominal: Soft. Normal appearance and bowel sounds are normal. She exhibits no distension and no mass. There is no tenderness. There is no rigidity, no rebound and no guarding.  Musculoskeletal: Normal range of motion. She exhibits no edema or tenderness.  Neurological: She is alert.  Oriented to person.  Skin: Skin is warm, dry and intact. No rash noted. She is not diaphoretic. No erythema. No pallor.  Psychiatric: She has a normal mood and affect. Her speech is normal and behavior is normal. Judgment and thought content normal.  Nursing note and vitals reviewed.   ED Course  Procedures (including critical care time)  Labs Review Labs Reviewed  CBC WITH DIFFERENTIAL/PLATELET - Abnormal; Notable for the following:    Platelets 137 (*)    Neutrophils Relative % 83 (*)    Lymphocytes Relative  9 (*)    All other components within normal limits  BASIC METABOLIC PANEL - Abnormal; Notable for the following:    Glucose, Bld 112 (*)    BUN 26 (*)    Creatinine, Ser 1.25 (*)    Calcium 8.6 (*)    GFR calc non Af Amer 37 (*)    GFR calc Af Amer 43 (*)    All other components within normal limits  URINALYSIS, ROUTINE W REFLEX MICROSCOPIC (NOT AT [facility]) - Abnormal; Notable for the following:    APPearance TURBID (*)    Hgb urine dipstick LARGE (*)    Ketones, ur 15 (*)    Leukocytes, UA LARGE (*)    All other components within normal limits  URINE MICROSCOPIC-ADD ON - Abnormal; Notable for the following:    Bacteria, UA MANY (*)    Crystals TRIPLE PHOSPHATE CRYSTALS (*)    All other components within normal limits  POC OCCULT BLOOD, ED - Abnormal; Notable for the following:    Fecal Occult Bld POSITIVE (*)    All other components within normal limits  URINE CULTURE    Imaging Review No results found.   I have personally reviewed and evaluated these lab results as part of my medical decision-making.   EKG Interpretation None      MDM   Final  diagnoses:  Blood in stool  UTI (lower urinary tract infection)   81 year old female presents to the ED s/p caregiver noticed frank blood in stool this morning. Reports one episode of diarrhea. Patient reports nausea, and her son, who is present at bedside, reports she has had a decreased appetite over the past 1-2 weeks. In the ED, he states she looks the best she has looked in a month.  Nausea treated with zofran. Patient is afebrile. Vital signs stable. Patient is alert and oriented to person. Abdomen soft, non-distended, non-tender to palpation. Hemoccult positive. Hemoglobin 13.3. BMP with creatinine 1.25, appears stable from baseline. UA shows UTI. Patient currently being treated for UTI. Discussed treatment with patient's nurse at nursing facility, who states the urine sample taken at the time of diagnosis of UTI was not cultured. She reports the patient was being treated with cipro, and recently was transitioned to bactrim.  Patient seen by and discussed with Dr. Vanita Panda. Patient is afebrile with stable vitals and a benign abdominal exam. She is hemoccult positive, however has no evidence of anemia on CBC and has had no episodes of bloody stools in the ED. UA demonstrates UTI, will continue treatment with bactrim and culture urine. Informed patient and son that antibiotic therapy will be adjusted appropriately as needed pending urine culture results. At this time, feel patient is stable for discharge. Will give zofran for nausea control. Patient to follow-up with PCP. Return precautions discussed.  BP 136/81 mmHg  Pulse 82  Temp(Src) 98.2 F (36.8 C) (Oral)  Resp 18  SpO2 94%     Marella Chimes, PA-C 01/27/15 1851  Carmin Muskrat, MD 01/28/15 [phone]

## 2015-01-29 LAB — URINE CULTURE: Culture: >=100000

## 2015-01-31 ENCOUNTER — Telehealth (HOSPITAL_BASED_OUTPATIENT_CLINIC_OR_DEPARTMENT_OTHER): Payer: Self-pay | Admitting: Emergency Medicine

## 2015-01-31 NOTE — Progress Notes (Signed)
ED Antimicrobial Stewardship Positive Culture Follow Up   Kathy Avery is an 81 y.o. female who presented to [facility] on 01/27/2015 with a chief complaint of  Chief Complaint  Patient presents with  . Rectal Bleeding  . Emesis    Recent Results (from the past 720 hour(s))  Urine culture     Status: None   Collection Time: 01/27/15  3:16 PM  Result Value Ref Range Status   Specimen Description URINE, RANDOM  Final   Special Requests NONE  Final   Culture   Final    >=100,000 COLONIES/mL PROTEUS MIRABILIS Performed at [facility]    Report Status 01/29/2015 FINAL  Final   Organism ID, Bacteria PROTEUS MIRABILIS  Final      Susceptibility   Proteus mirabilis - MIC*    AMPICILLIN 4 SENSITIVE Sensitive     CEFAZOLIN 16 SENSITIVE Sensitive     CEFTRIAXONE <=1 SENSITIVE Sensitive     CIPROFLOXACIN >=4 RESISTANT Resistant     GENTAMICIN >=16 RESISTANT Resistant     IMIPENEM 4 SENSITIVE Sensitive     NITROFURANTOIN 128 RESISTANT Resistant     TRIMETH/SULFA >=320 RESISTANT Resistant     AMPICILLIN/SULBACTAM 4 SENSITIVE Sensitive     PIP/TAZO <=4 SENSITIVE Sensitive     * >=100,000 COLONIES/mL PROTEUS MIRABILIS    [x]  Treated with Bactrim, organism resistant to prescribed antimicrobial []  Patient discharged originally without antimicrobial agent and treatment is now indicated  New antibiotic prescription: amoxicillin 500mg  po BID x 7 days  ED Provider: Delos Haring, PA-C   Candie Mile 01/31/2015, 9:17 AM Infectious Diseases Pharmacist Phone# [phone]

## 2015-01-31 NOTE — Telephone Encounter (Signed)
Spoke with son , Gauri Galvao again d/t not being able to reach anyone via telephone regarding culture results, Tamsen Meek provided name of care faciltiy is actually [facility], provided # for same, call made to [facility] spoke with Magda Paganini, made her aware of culture report and the need to change antibiotic, Magda Paganini states that Dr. [place] Crane, PCP has already changed the bactrim to Keflex several days ago and patient markedly better, therefore rx for amoxicillin declined by facility at the present time.

## 2015-01-31 NOTE — Telephone Encounter (Addendum)
Post ED Visit - Positive Culture Follow-up: Successful Patient Follow-Up  Culture assessed and recommendations reviewed by: [x]  Heide Guile, Pharm.D., BCPS-AQ ID []  Alycia Rossetti, Pharm.D., BCPS []  [place], Pharm.D., BCPS, AAHIVP []  Legrand Como, Pharm.D., BCPS, AAHIVP []  [place], Pharm.D. []  Milus Glazier, [place].D.  Positive urine culture  []  Patient discharged without antimicrobial prescription and treatment is now indicated [x]  Organism is resistant to prescribed ED discharge antimicrobial []  Patient with positive blood cultures  Changes discussed with ED provider: Delos Haring PA New antibiotic prescription stop Bactrim, Start Amoxicillin 500mg  po bid x 7 days Faxed recommendations to [facility] @ [phone]  Faxed to facility   Hazle Nordmann 01/31/2015, 11:32 AM

## 2015-02-06 ENCOUNTER — Other Ambulatory Visit (HOSPITAL_COMMUNITY): Payer: Self-pay | Admitting: Internal Medicine

## 2015-02-06 ENCOUNTER — Ambulatory Visit (HOSPITAL_COMMUNITY)
Admission: RE | Admit: 2015-02-06 | Discharge: 2015-02-06 | Disposition: A | Discharge status: Home / Self Care | Payer: Medicare Other | Source: Ambulatory Visit | Attending: Internal Medicine | Admitting: Internal Medicine

## 2015-02-06 DIAGNOSIS — R05 Cough: Secondary | ICD-10-CM | POA: Diagnosis not present

## 2015-02-06 DIAGNOSIS — R059 Cough, unspecified: Secondary | ICD-10-CM

## 2015-02-06 DIAGNOSIS — R509 Fever, unspecified: Secondary | ICD-10-CM | POA: Insufficient documentation

## 2015-04-13 ENCOUNTER — Encounter (HOSPITAL_COMMUNITY): Payer: Self-pay | Admitting: *Deleted

## 2015-04-13 ENCOUNTER — Emergency Department (HOSPITAL_COMMUNITY): Payer: Medicare Other

## 2015-04-13 ENCOUNTER — Inpatient Hospital Stay (HOSPITAL_COMMUNITY)
Admission: EM | Admit: 2015-04-13 | Discharge: 2015-04-18 | DRG: 872 | Disposition: E | Payer: Medicare Other | Attending: Family Medicine | Admitting: Family Medicine

## 2015-04-13 DIAGNOSIS — F329 Major depressive disorder, single episode, unspecified: Secondary | ICD-10-CM | POA: Diagnosis present

## 2015-04-13 DIAGNOSIS — E785 Hyperlipidemia, unspecified: Secondary | ICD-10-CM | POA: Diagnosis present

## 2015-04-13 DIAGNOSIS — G309 Alzheimer's disease, unspecified: Secondary | ICD-10-CM | POA: Diagnosis present

## 2015-04-13 DIAGNOSIS — N390 Urinary tract infection, site not specified: Secondary | ICD-10-CM | POA: Diagnosis not present

## 2015-04-13 DIAGNOSIS — F419 Anxiety disorder, unspecified: Secondary | ICD-10-CM | POA: Diagnosis present

## 2015-04-13 DIAGNOSIS — Z79899 Other long term (current) drug therapy: Secondary | ICD-10-CM

## 2015-04-13 DIAGNOSIS — R7989 Other specified abnormal findings of blood chemistry: Secondary | ICD-10-CM | POA: Diagnosis not present

## 2015-04-13 DIAGNOSIS — I469 Cardiac arrest, cause unspecified: Secondary | ICD-10-CM | POA: Diagnosis not present

## 2015-04-13 DIAGNOSIS — E872 Acidosis, unspecified: Secondary | ICD-10-CM | POA: Diagnosis present

## 2015-04-13 DIAGNOSIS — Z66 Do not resuscitate: Secondary | ICD-10-CM | POA: Diagnosis present

## 2015-04-13 DIAGNOSIS — I10 Essential (primary) hypertension: Secondary | ICD-10-CM | POA: Diagnosis present

## 2015-04-13 DIAGNOSIS — R509 Fever, unspecified: Secondary | ICD-10-CM | POA: Diagnosis present

## 2015-04-13 DIAGNOSIS — Z7982 Long term (current) use of aspirin: Secondary | ICD-10-CM

## 2015-04-13 DIAGNOSIS — R778 Other specified abnormalities of plasma proteins: Secondary | ICD-10-CM | POA: Diagnosis present

## 2015-04-13 DIAGNOSIS — R748 Abnormal levels of other serum enzymes: Secondary | ICD-10-CM | POA: Diagnosis present

## 2015-04-13 DIAGNOSIS — F0280 Dementia in other diseases classified elsewhere without behavioral disturbance: Secondary | ICD-10-CM | POA: Diagnosis present

## 2015-04-13 DIAGNOSIS — K219 Gastro-esophageal reflux disease without esophagitis: Secondary | ICD-10-CM | POA: Diagnosis present

## 2015-04-13 DIAGNOSIS — A419 Sepsis, unspecified organism: Secondary | ICD-10-CM | POA: Diagnosis not present

## 2015-04-13 HISTORY — DX: Do not resuscitate: Z66

## 2015-04-13 LAB — CBC WITH DIFFERENTIAL/PLATELET
BASOS PCT: 0 %
Basophils Absolute: 0 10*3/uL (ref 0.0–0.1)
EOS ABS: 0 10*3/uL (ref 0.0–0.7)
EOS PCT: 0 %
HCT: 40.4 % (ref 36.0–46.0)
Hemoglobin: 12.9 g/dL (ref 12.0–15.0)
Lymphocytes Relative: 2 %
Lymphs Abs: 0.4 10*3/uL — ABNORMAL LOW (ref 0.7–4.0)
MCH: 30.3 pg (ref 26.0–34.0)
MCHC: 31.9 g/dL (ref 30.0–36.0)
MCV: 94.8 fL (ref 78.0–100.0)
MONO ABS: 1.7 10*3/uL — AB (ref 0.1–1.0)
MONOS PCT: 8 %
Neutro Abs: 18.8 10*3/uL — ABNORMAL HIGH (ref 1.7–7.7)
Neutrophils Relative %: 90 %
PLATELETS: 132 10*3/uL — AB (ref 150–400)
RBC: 4.26 MIL/uL (ref 3.87–5.11)
RDW: 14.1 % (ref 11.5–15.5)
WBC: 20.9 10*3/uL — ABNORMAL HIGH (ref 4.0–10.5)

## 2015-04-13 LAB — COMPREHENSIVE METABOLIC PANEL
ALBUMIN: 3 g/dL — AB (ref 3.5–5.0)
ALK PHOS: 80 U/L (ref 38–126)
ALT: 15 U/L (ref 14–54)
AST: 32 U/L (ref 15–41)
Anion gap: 13 (ref 5–15)
BILIRUBIN TOTAL: 1 mg/dL (ref 0.3–1.2)
BUN: 32 mg/dL — ABNORMAL HIGH (ref 6–20)
CALCIUM: 8.4 mg/dL — AB (ref 8.9–10.3)
CO2: 23 mmol/L (ref 22–32)
CREATININE: 1.14 mg/dL — AB (ref 0.44–1.00)
Chloride: 107 mmol/L (ref 101–111)
GFR calc non Af Amer: 41 mL/min — ABNORMAL LOW (ref >60)
GFR, EST AFRICAN AMERICAN: 48 mL/min — AB (ref >60)
GLUCOSE: 111 mg/dL — AB (ref 65–99)
Potassium: 3.6 mmol/L (ref 3.5–5.1)
SODIUM: 143 mmol/L (ref 135–145)
Total Protein: 6.1 g/dL — ABNORMAL LOW (ref 6.5–8.1)

## 2015-04-13 LAB — I-STAT TROPONIN, ED: TROPONIN I, POC: 0.84 ng/mL — AB (ref 0.00–0.08)

## 2015-04-13 LAB — URINALYSIS, ROUTINE W REFLEX MICROSCOPIC
Glucose, UA: NEGATIVE mg/dL
KETONES UR: 15 mg/dL — AB
NITRITE: NEGATIVE
PH: 6.5 (ref 5.0–8.0)
Protein, ur: 100 mg/dL — AB
Specific Gravity, Urine: 1.02 (ref 1.005–1.030)

## 2015-04-13 LAB — URINE MICROSCOPIC-ADD ON: Squamous Epithelial / LPF: NONE SEEN

## 2015-04-13 LAB — I-STAT CG4 LACTIC ACID, ED
Lactic Acid, Venous: 2.19 mmol/L (ref 0.5–2.0)
Lactic Acid, Venous: 2.27 mmol/L (ref 0.5–2.0)

## 2015-04-13 LAB — LIPASE, BLOOD: Lipase: 18 U/L (ref 11–51)

## 2015-04-13 MED ORDER — SODIUM CHLORIDE 0.9 % IV BOLUS (SEPSIS)
500.0000 mL | INTRAVENOUS | Status: AC
  Administered 2015-04-13: 500 mL via INTRAVENOUS

## 2015-04-13 MED ORDER — ASPIRIN 81 MG PO CHEW: 81.0000 mg | CHEWABLE_TABLET | Freq: Every day | ORAL | Status: DC

## 2015-04-13 MED ORDER — SODIUM CHLORIDE 0.9 % IV BOLUS (SEPSIS)
1000.0000 mL | INTRAVENOUS | Status: AC
  Administered 2015-04-13 (×2): 1000 mL via INTRAVENOUS

## 2015-04-13 MED ORDER — ACETAMINOPHEN 650 MG RE SUPP: 650.0000 mg | Freq: Four times a day (QID) | RECTAL | Status: DC | PRN

## 2015-04-13 MED ORDER — POLYETHYLENE GLYCOL 3350 17 G PO PACK: 17.0000 g | PACK | Freq: Every day | ORAL | Status: DC | PRN

## 2015-04-13 MED ORDER — PANTOPRAZOLE SODIUM 40 MG PO TBEC: 40.0000 mg | DELAYED_RELEASE_TABLET | Freq: Every day | ORAL | Status: DC

## 2015-04-13 MED ORDER — POTASSIUM CHLORIDE IN NACL 20-0.9 MEQ/L-% IV SOLN: INTRAVENOUS | Status: DC

## 2015-04-13 MED ORDER — CITALOPRAM HYDROBROMIDE 20 MG PO TABS: 40.0000 mg | ORAL_TABLET | Freq: Every day | ORAL | Status: DC

## 2015-04-13 MED ORDER — ALUM & MAG HYDROXIDE-SIMETH 200-200-20 MG/5ML PO SUSP: 30.0000 mL | Freq: Four times a day (QID) | ORAL | Status: DC | PRN

## 2015-04-13 MED ORDER — MEMANTINE HCL 10 MG PO TABS
10.0000 mg | ORAL_TABLET | Freq: Two times a day (BID) | ORAL | Status: DC
  Filled 2015-04-13: qty 1

## 2015-04-13 MED ORDER — LORAZEPAM 0.5 MG PO TABS
0.5000 mg | ORAL_TABLET | Freq: Two times a day (BID) | ORAL | Status: DC
  Filled 2015-04-13: qty 1

## 2015-04-13 MED ORDER — ENOXAPARIN SODIUM 40 MG/0.4ML ~~LOC~~ SOLN
40.0000 mg | SUBCUTANEOUS | Status: DC
  Filled 2015-04-13: qty 0.4

## 2015-04-13 MED ORDER — ACETAMINOPHEN 650 MG RE SUPP
650.0000 mg | Freq: Once | RECTAL | Status: AC
  Administered 2015-04-13: 650 mg via RECTAL
  Filled 2015-04-13: qty 1

## 2015-04-13 MED ORDER — VANCOMYCIN HCL IN DEXTROSE 1-5 GM/200ML-% IV SOLN
1000.0000 mg | Freq: Once | INTRAVENOUS | Status: AC
  Administered 2015-04-13: 1000 mg via INTRAVENOUS
  Filled 2015-04-13: qty 200

## 2015-04-13 MED ORDER — PIPERACILLIN-TAZOBACTAM 3.375 G IVPB 30 MIN
3.3750 g | Freq: Once | INTRAVENOUS | Status: AC
  Administered 2015-04-13: 3.375 g via INTRAVENOUS
  Filled 2015-04-13: qty 50

## 2015-04-13 MED ORDER — ENSURE ENLIVE PO LIQD: 237.0000 mL | Freq: Two times a day (BID) | ORAL | Status: DC

## 2015-04-13 MED ORDER — ACETAMINOPHEN 325 MG PO TABS
650.0000 mg | ORAL_TABLET | Freq: Once | ORAL | Status: DC
  Filled 2015-04-13: qty 2

## 2015-04-13 MED ORDER — ONDANSETRON HCL 4 MG PO TABS: 4.0000 mg | ORAL_TABLET | Freq: Four times a day (QID) | ORAL | Status: DC | PRN

## 2015-04-13 MED ORDER — DOCUSATE SODIUM 100 MG PO CAPS
100.0000 mg | ORAL_CAPSULE | Freq: Two times a day (BID) | ORAL | Status: DC
  Filled 2015-04-13: qty 1

## 2015-04-13 MED ORDER — ONDANSETRON HCL 4 MG/2ML IJ SOLN: 4.0000 mg | Freq: Four times a day (QID) | INTRAMUSCULAR | Status: DC | PRN

## 2015-04-13 MED ORDER — FAMOTIDINE 20 MG PO TABS
20.0000 mg | ORAL_TABLET | Freq: Every day | ORAL | Status: DC
  Filled 2015-04-13: qty 1

## 2015-04-13 MED ORDER — RIVASTIGMINE 9.5 MG/24HR TD PT24
9.5000 mg | MEDICATED_PATCH | Freq: Every day | TRANSDERMAL | Status: DC
  Filled 2015-04-13 (×2): qty 1

## 2015-04-13 MED ORDER — ACETAMINOPHEN 325 MG PO TABS: 650.0000 mg | ORAL_TABLET | Freq: Four times a day (QID) | ORAL | Status: DC | PRN

## 2015-04-13 MED ORDER — SODIUM CHLORIDE 0.9 % IJ SOLN: 3.0000 mL | Freq: Two times a day (BID) | INTRAMUSCULAR | Status: DC

## 2015-04-13 MED ORDER — ROSUVASTATIN CALCIUM 10 MG PO TABS
10.0000 mg | ORAL_TABLET | Freq: Every day | ORAL | Status: DC
  Filled 2015-04-13: qty 1

## 2015-04-14 LAB — URINE CULTURE

## 2015-04-16 LAB — CULTURE, BLOOD (ROUTINE X 2)

## 2015-04-18 NOTE — ED Provider Notes (Signed)
CSN: [id number]     Arrival date & time 04/17/15  1413 History   First MD Initiated Contact with Patient 04-17-15 1446     Chief Complaint  Patient presents with  . Emesis  . Fever      Patient is a 81 y.o. female presenting with vomiting and fever. The history is provided by the nursing home and the EMS personnel. The history is limited by the condition of the patient (Hx dementia).  Emesis Fever Associated symptoms: vomiting   Pt was seen at 1510.  Per EMS and NH report: NH states pt "felt warm" to touch this morning. EMS states temp "100.4" tympanic on their arrival to scene, BP "85/65." NH also states pt has had N/V today, as well as malodorous urine. Pt has significant hx of dementia.    Past Medical History  Diagnosis Date  . Hypertension   . Depression   . Anxiety   . Alzheimer disease   . GERD (gastroesophageal reflux disease)   . Hyperlipidemia   . Pelvic fracture ([place])   . Clavicular fracture   . Cancer ([place])     colon  . DNR (do not resuscitate)    Past Surgical History  Procedure Laterality Date  . Fracture surgery      pelvis    Social History  Substance Use Topics  . Smoking status: Never Smoker   . Smokeless tobacco: Never Used  . Alcohol Use: No    Review of Systems  Unable to perform ROS: Dementia  Constitutional: Positive for fever.  Gastrointestinal: Positive for vomiting.      Allergies  Morphine and related  Home Medications   Prior to Admission medications   Medication Sig Start Date End Date Taking? Authorizing Provider  acetaminophen (TYLENOL) 500 MG tablet Take 1,000 mg by mouth every 8 (eight) hours as needed for mild pain.    Historical Provider, MD  aspirin 81 MG chewable tablet Chew 1 tablet (81 mg total) by mouth daily. 04/17/13   Teressa Lower, MD  Calcium Carbonate-Vitamin D (CALCIUM 600/VITAMIN D PO) Take 1 tablet by mouth daily.      Historical Provider, MD  cefUROXime (CEFTIN) 250 MG tablet Take 1 tablet (250 mg total) by  mouth 2 (two) times daily with a meal. Patient not taking: Reported on 08/03/2014 05/24/13   Dixie Dials, MD  Cholecalciferol (VITAMIN D) 1000 UNITS capsule Take 1,000 Units by mouth daily.      Historical Provider, MD  ciprofloxacin (CIPRO) 500 MG tablet Take 1 tablet (500 mg total) by mouth 2 (two) times daily. One po bid x 7 days Patient not taking: Reported on 01/27/2015 08/03/14   Nat Christen, MD  citalopram (CELEXA) 40 MG tablet Take 40 mg by mouth daily.     Historical Provider, MD  docusate sodium (COLACE) 100 MG capsule Take 100 mg by mouth 2 (two) times daily.    Historical Provider, MD  feeding supplement, ENSURE COMPLETE, (ENSURE COMPLETE) LIQD Take 237 mLs by mouth daily. Patient taking differently: Take 237 mLs by mouth 2 (two) times daily. Strawberry 05/24/13   Dixie Dials, MD  HYDROcodone-acetaminophen (NORCO/VICODIN) 5-325 MG per tablet Take 1 tablet by mouth 2 (two) times daily. Give at 9am and 4pm Patient not taking: Reported on 08/03/2014 05/24/13   Dixie Dials, MD  LORazepam (ATIVAN) 0.5 MG tablet Take 0.5 mg by mouth 2 (two) times daily.    Historical Provider, MD  meloxicam (MOBIC) 7.5 MG tablet Take 7.5 mg by mouth  daily.    Historical Provider, MD  memantine (NAMENDA) 10 MG tablet Take 10 mg by mouth 2 (two) times daily.    Historical Provider, MD  omeprazole (PRILOSEC) 20 MG capsule Take 20 mg by mouth every morning.     Historical Provider, MD  ondansetron (ZOFRAN ODT) 4 MG disintegrating tablet Take 1 tablet (4 mg total) by mouth every 8 (eight) hours as needed for nausea. 01/27/15   Marella Chimes, PA-C  polyethylene glycol [facility] / GLYCOLAX) packet Take 17 g by mouth daily.      Historical Provider, MD  ranitidine (ZANTAC) 150 MG capsule Take 150 mg by mouth every evening.      Historical Provider, MD  rivastigmine (EXELON) 9.5 mg/24hr Place 1 patch onto the skin daily.      Historical Provider, MD  rosuvastatin (CRESTOR) 10 MG tablet Take 10 mg by mouth at bedtime.      Historical Provider, MD  vitamin C (ASCORBIC ACID) 500 MG tablet Take 1,000 mg by mouth daily.     Historical Provider, MD   BP 122/77 mmHg  Pulse 106  Temp(Src) 100.7 F (38.2 C) (Rectal)  Resp 22  Ht 5\' 6"  (1.676 m)  Wt 160 lb (72.576 kg)  BMI 25.84 kg/m2  SpO2 96%   Filed Vitals:   April 27, 2015 1424 04/27/2015 1500 April 27, 2015 1533 04-27-2015 1537  BP: 118/60 125/70  122/77  Pulse: 110 108  106  Temp: 102.3 F (39.1 C)  100.7 F (38.2 C)   TempSrc: Rectal  Rectal   Resp: 13 21  22   Height: 5\' 6"  (1.676 m)     Weight: 160 lb (72.576 kg)     SpO2: 85% 95%  96%    Physical Exam  1515; Physical examination:  Nursing notes reviewed; Vital signs and O2 SAT reviewed;  Constitutional: Well developed, Well nourished, In no acute distress; Head:  Normocephalic, atraumatic; Eyes: EOMI, PERRL, No scleral icterus; ENMT: Mouth and pharynx normal, Mucous membranes dry; Neck: Supple, Full range of motion, No lymphadenopathy; Cardiovascular: Tachycardic rate and rhythm, No gallop; Respiratory: Breath sounds clear & equal bilaterally, No wheezes.  Normal respiratory effort/excursion; Chest: Nontender, Movement normal; Abdomen: Soft, Nontender, Nondistended, Normal bowel sounds; Genitourinary: No CVA tenderness; Extremities: Pulses normal, No tenderness, No edema, No calf edema or asymmetry.; Neuro: Awake, alert, confused per hx dementia. Eyes open spontaneously. Moans and screams when ever or where ever ED staff touches her. No facial droop. Moves extremities spontaneously..; Skin: Color normal, Warm, Dry.   ED Course  Procedures (including critical care time) Labs Review   Imaging Review  I have personally reviewed and evaluated these images and lab results as part of my medical decision-making.   EKG Interpretation   Date/Time:  2015/04/27 14:28:31 EST Ventricular Rate:  109 PR Interval:  137 QRS Duration: 127 QT Interval:  396 QTC Calculation: 533 R Axis:   -169 Text  Interpretation:  Sinus tachycardia Nonspecific intraventricular  conduction delay Anterior infarct, old Borderline T abnormalities,  inferior leads No significant change since last tracing Confirmed by  Winfred Leeds  MD, SAM [phone]) on 04-27-15 2:37:29 PM      MDM  MDM Reviewed: previous chart, nursing note and vitals Reviewed previous: labs and ECG Interpretation: labs, ECG and x-ray Total time providing critical care: 30-74 minutes. This excludes time spent performing separately reportable procedures and services. Consults: admitting MD     CRITICAL CARE Performed by: Alfonzo Feller Total critical care time: 35 minutes Critical  care time was exclusive of separately billable procedures and treating other patients. Critical care was necessary to treat or prevent imminent or life-threatening deterioration. Critical care was time spent personally by me on the following activities: development of treatment plan with patient and/or surrogate as well as nursing, discussions with consultants, evaluation of patient's response to treatment, examination of patient, obtaining history from patient or surrogate, ordering and performing treatments and interventions, ordering and review of laboratory studies, ordering and review of radiographic studies, pulse oximetry and re-evaluation of patient's condition.   Results for orders placed or performed during the hospital encounter of May 08, 2015  Comprehensive metabolic panel  Result Value Ref Range   Sodium 143 135 - 145 mmol/L   Potassium 3.6 3.5 - 5.1 mmol/L   Chloride 107 101 - 111 mmol/L   CO2 23 22 - 32 mmol/L   Glucose, Bld 111 (H) 65 - 99 mg/dL   BUN 32 (H) 6 - 20 mg/dL   Creatinine, Ser 1.14 (H) 0.44 - 1.00 mg/dL   Calcium 8.4 (L) 8.9 - 10.3 mg/dL   Total Protein 6.1 (L) 6.5 - 8.1 g/dL   Albumin 3.0 (L) 3.5 - 5.0 g/dL   AST 32 15 - 41 U/L   ALT 15 14 - 54 U/L   Alkaline Phosphatase 80 38 - 126 U/L   Total Bilirubin 1.0 0.3 - 1.2  mg/dL   GFR calc non Af Amer 41 (L) >60 mL/min   GFR calc Af Amer 48 (L) >60 mL/min   Anion gap 13 5 - 15  CBC WITH DIFFERENTIAL  Result Value Ref Range   WBC 20.9 (H) 4.0 - 10.5 K/uL   RBC 4.26 3.87 - 5.11 MIL/uL   Hemoglobin 12.9 12.0 - 15.0 g/dL   HCT 40.4 36.0 - 46.0 %   MCV 94.8 78.0 - 100.0 fL   MCH 30.3 26.0 - 34.0 pg   MCHC 31.9 30.0 - 36.0 g/dL   RDW 14.1 11.5 - 15.5 %   Platelets 132 (L) 150 - 400 K/uL   Neutrophils Relative % 90 %   Neutro Abs 18.8 (H) 1.7 - 7.7 K/uL   Lymphocytes Relative 2 %   Lymphs Abs 0.4 (L) 0.7 - 4.0 K/uL   Monocytes Relative 8 %   Monocytes Absolute 1.7 (H) 0.1 - 1.0 K/uL   Eosinophils Relative 0 %   Eosinophils Absolute 0.0 0.0 - 0.7 K/uL   Basophils Relative 0 %   Basophils Absolute 0.0 0.0 - 0.1 K/uL  Urinalysis, Routine w reflex microscopic (not at [facility]  Result Value Ref Range   Color, Urine YELLOW YELLOW   APPearance HAZY (A) CLEAR   Specific Gravity, Urine 1.020 1.005 - 1.030   pH 6.5 5.0 - 8.0   Glucose, UA NEGATIVE NEGATIVE mg/dL   Hgb urine dipstick LARGE (A) NEGATIVE   Bilirubin Urine MODERATE (A) NEGATIVE   Ketones, ur 15 (A) NEGATIVE mg/dL   Protein, ur 100 (A) NEGATIVE mg/dL   Nitrite NEGATIVE NEGATIVE   Leukocytes, UA LARGE (A) NEGATIVE  Lipase, blood  Result Value Ref Range   Lipase 18 11 - 51 U/L  Urine microscopic-add on  Result Value Ref Range   Squamous Epithelial / LPF NONE SEEN NONE SEEN   WBC, UA TOO NUMEROUS TO COUNT 0 - 5 WBC/hpf   RBC / HPF TOO NUMEROUS TO COUNT 0 - 5 RBC/hpf   Bacteria, UA MANY (A) NONE SEEN  I-Stat CG4 Lactic Acid, ED  (not at  [facility])  Result Value Ref Range   Lactic Acid, Venous 2.19 (HH) 0.5 - 2.0 mmol/L   Comment NOTIFIED PHYSICIAN   I-stat troponin, ED  Result Value Ref Range   Troponin i, poc 0.84 (HH) 0.00 - 0.08 ng/mL   Comment 3           Dg Chest Port 1 View 18-Apr-2015  CLINICAL DATA:  Fever hypoxia vomiting today EXAM: PORTABLE CHEST 1 VIEW COMPARISON:  02/06/2015  FINDINGS: Significant elevation of the right diaphragm stable. Moderate cardiac enlargement. Uncoiling of the aorta. Vascular pattern normal. Lungs clear. Prominent aortic arch. Old left clavicle fracture stable. IMPRESSION: Cardiac enlargement without pulmonary edema. Stable right diaphragm elevation. Aortic arch more prominent than it has been on numerous prior studies dating back to 2014. Aortic aneurysm not excluded. Consider CT thorax. Electronically Signed   By: Skipper Cliche M.D.   On: 04-18-15 15:31    1710:  Code Sepsis called on pt's arrival and order set placed, undifferentiated source abx started. APAP given for fever. IVF bolus given with improvement in SBP to 110-130's and HR improved from 110's to 90's. Troponin mildly elevated, but EKG unchanged from previous. EPIC chart reviewed: CT chest 2011 without aneurysm.  T/C to [facility] Dr. Nehemiah Settle, case discussed, including:  HPI, pertinent PM/SHx, VS/PE, dx testing, ED course and treatment:  Agreeable to admit, hold CT chest at this time, requests to write temporary orders, obtain tele bed to team APAdmits.    Francine Graven, DO 04/16/15 1934

## 2015-04-18 NOTE — ED Notes (Signed)
Blood cultures to be drawn before IV antibiotics started.

## 2015-04-18 NOTE — ED Notes (Signed)
O2 increased to 3L

## 2015-04-18 NOTE — ED Provider Notes (Signed)
Medical Screening exam : Level V caveat dementia patient noted to be febrile reportedly vomiting this morning. Noted to be hypoxic. Complaining of abdominal pain.. She denies shortness of breath. On exam patient is chronically and acutely ill-appearing noted be febrile. Opens eyes to verbal stimulus. Mucous members dry lungs clear auscultation heart regular rate and rhythm abdomen nondistended, mildly diffusely tender  Orlie Dakin, MD 16-Apr-2015 1605

## 2015-04-18 NOTE — ED Notes (Addendum)
Resident of [facility]. N/V began this morning. Warm to the touch, 100.4 tympanic, per EMS. CBG of 150. EMS also states malodorous urine.  Nurse called and stated that pt vomited x 3 this morning then began heaving. Initial BP of 85/65, palced in trendelenburg and was [id number] systolic. Pt does not normally require O2. Also states sore to left heel is from an injury (kicked a wheel chair) and is not a pressure sore. Temp on arrival 102.3 Rectal and RA O2 of 87 Please call Fabio Pierce, RN for any further questions [phone]

## 2015-04-18 NOTE — Discharge Summary (Addendum)
Death Summary  Kathy Avery [id number] DOB: October 31, 1923 DOA: May 08, 2015  PCP: Wende Neighbors, MD PCP/Office Notified: No  Admit date: 05/08/2015 Date of Death: 05/08/2015  Final Diagnoses:  1. Sepsis 2. Urinary tract infection  History of present illness:  Patient unreliable historian given her advanced Alzheimer's disease and no family present. The patient has medical history of hypertension, depression, severe Alzheimer's disease, GERD, hyperlipidemia, History is obtained from the chart. The patient was brought to the hospital by EMS after patient was noted to be febrile and vomiting earlier today. Patient is a resident of [facility]. Her nausea and vomiting began this morning and was found to be warm to touch. Temperature was noted to by 100.4, patient was tachycardic, and initial blood pressure was 85/65. No biliary provoking factors noted by EMS or residential care.  Hospital Course:  Patient had arrived to medical floor. She had been visiting with her son when she went into V. tach and subsequently in PEA. She had no respiratory activity for greater than 60 seconds and confirmed no heartbeat or pulse on exam. The pulseless electrical activity actually decreased, then stopped.  Time of Death: 2033-09-11  Signed:  Truett Mainland, DO [facility] 2015-05-08, 8:32 PM

## 2015-04-18 NOTE — H&P (Addendum)
History and Physical  Kathy Avery [id number] DOB: 07-27-1923 DOA: May 10, 2015  Referring physician: Dr Thurnell Garbe, ED physician PCP: Wende Neighbors, MD   Chief Complaint: Emesis and fever  HPI: Kathy Avery is a 81 y.o. female  Patient unreliable historian given her advanced Alzheimer's disease and no family present. The patient has medical history of hypertension, depression, severe Alzheimer's disease, GERD, hyperlipidemia, History is obtained from the chart. The patient was brought to the hospital by EMS after patient was noted to be febrile and vomiting earlier today. Patient is a resident of [facility]. Her nausea and vomiting began this morning and was found to be warm to touch. Temperature was noted to by 100.4, patient was tachycardic, and initial blood pressure was 85/65. No biliary provoking factors noted by EMS or residential care.   Review of Systems:  Unable to obtain  Past Medical History  Diagnosis Date  . Hypertension   . Depression   . Anxiety   . Alzheimer disease   . GERD (gastroesophageal reflux disease)   . Hyperlipidemia   . Pelvic fracture ([place])   . Clavicular fracture   . Cancer ([place])     colon  . DNR (do not resuscitate)    Past Surgical History  Procedure Laterality Date  . Fracture surgery      pelvis   Social History:  reports that she has never smoked. She has never used smokeless tobacco. She reports that she does not drink alcohol or use illicit drugs. Patient lives at [facility]   Allergies  Allergen Reactions  . Morphine And Related Other (See Comments)    unknown   Not able to obtained due to patient's condition.  Prior to Admission medications   Medication Sig Start Date End Date Taking? Authorizing Provider  aspirin 81 MG chewable tablet Chew 1 tablet (81 mg total) by mouth daily. 04/17/13  Yes Teressa Lower, MD  Calcium Carbonate-Vitamin D (CALCIUM 600/VITAMIN D PO) Take 1 tablet by mouth daily.     Yes Historical  Provider, MD  Cholecalciferol (VITAMIN D) 1000 UNITS capsule Take 1,000 Units by mouth daily.     Yes Historical Provider, MD  citalopram (CELEXA) 40 MG tablet Take 40 mg by mouth daily.    Yes Historical Provider, MD  docusate sodium (COLACE) 100 MG capsule Take 100 mg by mouth 2 (two) times daily.   Yes Historical Provider, MD  feeding supplement, ENSURE COMPLETE, (ENSURE COMPLETE) LIQD Take 237 mLs by mouth daily. Patient taking differently: Take 237 mLs by mouth 2 (two) times daily. Strawberry 05/24/13  Yes Dixie Dials, MD  LORazepam (ATIVAN) 0.5 MG tablet Take 0.5 mg by mouth 2 (two) times daily.   Yes Historical Provider, MD  meloxicam (MOBIC) 7.5 MG tablet Take 7.5 mg by mouth daily.   Yes Historical Provider, MD  memantine (NAMENDA) 10 MG tablet Take 10 mg by mouth 2 (two) times daily.   Yes Historical Provider, MD  omeprazole (PRILOSEC) 20 MG capsule Take 20 mg by mouth every morning.    Yes Historical Provider, MD  ondansetron (ZOFRAN ODT) 4 MG disintegrating tablet Take 1 tablet (4 mg total) by mouth every 8 (eight) hours as needed for nausea. 01/27/15  Yes Marella Chimes, PA-C  polyethylene glycol (MIRALAX / GLYCOLAX) packet Take 17 g by mouth daily.     Yes Historical Provider, MD  ranitidine (ZANTAC) 150 MG capsule Take 150 mg by mouth every evening.     Yes Historical  Provider, MD  rivastigmine (EXELON) 9.5 mg/24hr Place 1 patch onto the skin daily.     Yes Historical Provider, MD  rosuvastatin (CRESTOR) 10 MG tablet Take 10 mg by mouth at bedtime.    Yes Historical Provider, MD  vitamin C (ASCORBIC ACID) 500 MG tablet Take 1,000 mg by mouth daily.    Yes Historical Provider, MD    Physical Exam: BP 97/64 mmHg  Pulse 96  Temp(Src) 99.6 F (37.6 C) (Rectal)  Resp 20  Ht 5\' 6"  (1.676 m)  Wt 72.576 kg (160 lb)  BMI 25.84 kg/m2  SpO2 95%  General: Elderly Caucasian female. Awake and alert and oriented to person. No acute cardiopulmonary distress.  Eyes: Pupils equal,  round, reactive to light. Extraocular muscles are intact. Sclerae anicteric and noninjected.  ENT: Dry mucosal membranes. No mucosal lesions.  Neck: Neck supple without lymphadenopathy. No carotid bruits. No masses palpated.  Cardiovascular: Regular rate with normal S1-S2 sounds. No murmurs, rubs, gallops auscultated. No JVD.  Respiratory: Good respiratory effort with no wheezes, rales, rhonchi. Lungs clear to auscultation bilaterally.  Abdomen: Soft, tender to palpation particularly in the suprapubic area, nondistended. Active bowel sounds. No masses or hepatosplenomegaly  Skin: Dry, warm to touch. 2+ dorsalis pedis and radial pulses. Musculoskeletal: No calf or leg pain. All major joints not erythematous nontender.  Psychiatric:  Unable to determine Neurologic: No focal neurological deficits. Cranial nerves II through XII are grossly intact.           Labs on Admission:  Basic Metabolic Panel:  Recent Labs Lab Apr 26, 2015 1505  NA 143  K 3.6  CL 107  CO2 23  GLUCOSE 111*  BUN 32*  CREATININE 1.14*  CALCIUM 8.4*   Liver Function Tests:  Recent Labs Lab Apr 26, 2015 1505  AST 32  ALT 15  ALKPHOS 80  BILITOT 1.0  PROT 6.1*  ALBUMIN 3.0*    Recent Labs Lab 04/26/15 1516  LIPASE 18   No results for input(s): AMMONIA in the last 168 hours. CBC:  Recent Labs Lab 2015/04/26 1504  WBC 20.9*  NEUTROABS 18.8*  HGB 12.9  HCT 40.4  MCV 94.8  PLT 132*   Cardiac Enzymes: No results for input(s): CKTOTAL, CKMB, CKMBINDEX, TROPONINI in the last 168 hours.  BNP (last 3 results) No results for input(s): BNP in the last 8760 hours.  ProBNP (last 3 results) No results for input(s): PROBNP in the last 8760 hours.  CBG: No results for input(s): GLUCAP in the last 168 hours.  Radiological Exams on Admission: Dg Chest Port 1 View  26-Apr-2015  CLINICAL DATA:  Fever hypoxia vomiting today EXAM: PORTABLE CHEST 1 VIEW COMPARISON:  02/06/2015 FINDINGS: Significant elevation of  the right diaphragm stable. Moderate cardiac enlargement. Uncoiling of the aorta. Vascular pattern normal. Lungs clear. Prominent aortic arch. Old left clavicle fracture stable. IMPRESSION: Cardiac enlargement without pulmonary edema. Stable right diaphragm elevation. Aortic arch more prominent than it has been on numerous prior studies dating back to 2014. Aortic aneurysm not excluded. Consider CT thorax. Electronically Signed   By: Skipper Cliche M.D.   On: Apr 26, 2015 15:31    EKG: Independently reviewed. Sinus tachycardia with ventricular rate of 109. Normal PR interval. Nonspecific intraventricular conduction delay. No ST elevation or depression.  Assessment/Plan Present on Admission:  . Sepsis ([place]) . UTI (lower urinary tract infection) . Elevated troponin I level . Lactic acidosis  This patient was discussed with the ED physician, including pertinent vitals, physical exam findings, labs, and  imaging.  We also discussed care given by the ED provider.  #1 sepsis  Admit to telemetry  Continue Zosyn  As this appears to be a urinary infection, will discontinue vancomycin given that MRSA UTI is extremely unlikely #2 UTI  Continue Zosyn  Blood cultures pending  Urine culture pending  Repeat CBC in the morning #3 lactic acidosis  Repeat lactic acid level #4 elevated troponin I level  I feel that this is likely secondary to heart strain from the sepsis and unlikely myocardial infarction.  Repeat troponin level now and every 3 hours 3 #5 dementia  Frequent orientation  Continue medications #6 hypertension  No antihypertensives currently   DVT prophylaxis: Lovenox  Consultants: None  Code Status: DO NOT RESUSCITATE  Family Communication: I spoke with patient's son Tyrisha Fitzhenry and advised him to his mother's condition.  Disposition Plan: Admission   Truett Mainland, DO [facility] Pager [phone]

## 2015-04-18 DEATH — deceased
